# Patient Record
Sex: Male | Born: 1990 | Race: White | Hispanic: No | Marital: Single | Smoking: Current every day smoker
Health system: Southern US, Community
[De-identification: ages and names within clinical notes are randomized; demographics above are authoritative.]

## PROBLEM LIST (undated history)

## (undated) DIAGNOSIS — H9190 Unspecified hearing loss, unspecified ear: Secondary | ICD-10-CM

## (undated) HISTORY — PX: MASTOIDECTOMY: SUR855

---
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
## 2010-08-17 ENCOUNTER — Emergency Department (HOSPITAL_COMMUNITY): Payer: Self-pay

## 2010-08-17 ENCOUNTER — Emergency Department (HOSPITAL_COMMUNITY)
Admission: EM | Admit: 2010-08-17 | Discharge: 2010-08-18 | Disposition: A | Discharge status: Home / Self Care | Payer: Self-pay | Attending: Emergency Medicine | Admitting: Emergency Medicine

## 2010-08-17 DIAGNOSIS — M25476 Effusion, unspecified foot: Secondary | ICD-10-CM | POA: Insufficient documentation

## 2010-08-17 DIAGNOSIS — M25473 Effusion, unspecified ankle: Secondary | ICD-10-CM | POA: Insufficient documentation

## 2010-08-17 DIAGNOSIS — M79609 Pain in unspecified limb: Secondary | ICD-10-CM | POA: Insufficient documentation

## 2010-08-17 DIAGNOSIS — W108XXA Fall (on) (from) other stairs and steps, initial encounter: Secondary | ICD-10-CM | POA: Insufficient documentation

## 2010-08-17 DIAGNOSIS — S93409A Sprain of unspecified ligament of unspecified ankle, initial encounter: Secondary | ICD-10-CM | POA: Insufficient documentation

## 2010-08-17 DIAGNOSIS — M25579 Pain in unspecified ankle and joints of unspecified foot: Secondary | ICD-10-CM | POA: Insufficient documentation

## 2011-03-22 ENCOUNTER — Emergency Department (HOSPITAL_COMMUNITY)
Admission: EM | Admit: 2011-03-22 | Discharge: 2011-03-22 | Disposition: A | Discharge status: Home / Self Care | Payer: No Typology Code available for payment source | Attending: Emergency Medicine | Admitting: Emergency Medicine

## 2011-03-22 ENCOUNTER — Encounter: Payer: Self-pay | Admitting: Nurse Practitioner

## 2011-03-22 ENCOUNTER — Emergency Department (HOSPITAL_COMMUNITY): Payer: Self-pay

## 2011-03-22 DIAGNOSIS — M25579 Pain in unspecified ankle and joints of unspecified foot: Secondary | ICD-10-CM | POA: Insufficient documentation

## 2011-03-22 DIAGNOSIS — R51 Headache: Secondary | ICD-10-CM | POA: Insufficient documentation

## 2011-03-22 DIAGNOSIS — M25559 Pain in unspecified hip: Secondary | ICD-10-CM | POA: Insufficient documentation

## 2011-03-22 DIAGNOSIS — M25572 Pain in left ankle and joints of left foot: Secondary | ICD-10-CM

## 2011-03-22 DIAGNOSIS — S161XXA Strain of muscle, fascia and tendon at neck level, initial encounter: Secondary | ICD-10-CM

## 2011-03-22 DIAGNOSIS — S139XXA Sprain of joints and ligaments of unspecified parts of neck, initial encounter: Secondary | ICD-10-CM | POA: Insufficient documentation

## 2011-03-22 DIAGNOSIS — M25569 Pain in unspecified knee: Secondary | ICD-10-CM | POA: Insufficient documentation

## 2011-03-22 DIAGNOSIS — IMO0002 Reserved for concepts with insufficient information to code with codable children: Secondary | ICD-10-CM | POA: Insufficient documentation

## 2011-03-22 DIAGNOSIS — R079 Chest pain, unspecified: Secondary | ICD-10-CM | POA: Insufficient documentation

## 2011-03-22 MED ORDER — DIAZEPAM 5 MG PO TABS: 5.0000 mg | ORAL_TABLET | Freq: Three times a day (TID) | ORAL | Status: AC | PRN

## 2011-03-22 MED ORDER — IBUPROFEN 800 MG PO TABS: 800.0000 mg | ORAL_TABLET | Freq: Three times a day (TID) | ORAL | Status: AC
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
## 2011-03-23 NOTE — ED Provider Notes (Signed)
Medical screening examination/treatment/procedure(s) were performed by non-physician practitioner and as supervising physician I was immediately available for consultation/collaboration.   Gerhard Munch, MD 03/23/11 [phone]

## 2012-02-01 ENCOUNTER — Encounter (HOSPITAL_COMMUNITY): Payer: Self-pay | Admitting: Emergency Medicine

## 2012-02-01 ENCOUNTER — Emergency Department (HOSPITAL_COMMUNITY)
Admission: EM | Admit: 2012-02-01 | Discharge: 2012-02-01 | Disposition: A | Discharge status: Home / Self Care | Payer: Self-pay | Attending: Emergency Medicine | Admitting: Emergency Medicine

## 2012-02-01 DIAGNOSIS — F1010 Alcohol abuse, uncomplicated: Secondary | ICD-10-CM | POA: Insufficient documentation

## 2012-02-01 DIAGNOSIS — F10929 Alcohol use, unspecified with intoxication, unspecified: Secondary | ICD-10-CM

## 2012-02-01 DIAGNOSIS — R404 Transient alteration of awareness: Secondary | ICD-10-CM | POA: Insufficient documentation

## 2012-02-01 DIAGNOSIS — F17200 Nicotine dependence, unspecified, uncomplicated: Secondary | ICD-10-CM | POA: Insufficient documentation

## 2012-02-01 DIAGNOSIS — F1210 Cannabis abuse, uncomplicated: Secondary | ICD-10-CM | POA: Insufficient documentation

## 2012-02-01 DIAGNOSIS — Z8669 Personal history of other diseases of the nervous system and sense organs: Secondary | ICD-10-CM | POA: Insufficient documentation

## 2012-02-01 HISTORY — DX: Unspecified hearing loss, unspecified ear: H91.90

## 2012-02-01 LAB — CBC
HCT: 43.4 % (ref 39.0–52.0)
Hemoglobin: 15.1 g/dL (ref 13.0–17.0)
MCHC: 34.8 g/dL (ref 30.0–36.0)
RBC: 5.05 MIL/uL (ref 4.22–5.81)
WBC: 9.7 10*3/uL (ref 4.0–10.5)

## 2012-02-01 LAB — POCT I-STAT, CHEM 8
Calcium, Ion: 1.12 mmol/L (ref 1.12–1.23)
Creatinine, Ser: 1.3 mg/dL (ref 0.50–1.35)
Glucose, Bld: 108 mg/dL — ABNORMAL HIGH (ref 70–99)
HCT: 48 % (ref 39.0–52.0)
Hemoglobin: 16.3 g/dL (ref 13.0–17.0)
Potassium: 3.4 mEq/L — ABNORMAL LOW (ref 3.5–5.1)
TCO2: 22 mmol/L (ref 0–100)

## 2012-02-01 LAB — RAPID URINE DRUG SCREEN, HOSP PERFORMED
Amphetamines: NOT DETECTED
Benzodiazepines: NOT DETECTED
Cocaine: NOT DETECTED
Opiates: NOT DETECTED
Tetrahydrocannabinol: NOT DETECTED

## 2012-02-01 LAB — ETHANOL: Alcohol, Ethyl (B): 290 mg/dL — ABNORMAL HIGH (ref 0–11)

## 2012-02-01 MED ORDER — POTASSIUM CHLORIDE 10 MEQ/100ML IV SOLN
10.0000 meq | Freq: Once | INTRAVENOUS | Status: AC
  Administered 2012-02-01: 10 meq via INTRAVENOUS
  Filled 2012-02-01: qty 100

## 2012-02-01 MED ORDER — POTASSIUM CHLORIDE CRYS ER 20 MEQ PO TBCR: 20.0000 meq | EXTENDED_RELEASE_TABLET | Freq: Once | ORAL | Status: DC

## 2012-02-01 MED ORDER — ONDANSETRON HCL 4 MG/2ML IJ SOLN
4.0000 mg | Freq: Once | INTRAMUSCULAR | Status: AC
  Administered 2012-02-01: 4 mg via INTRAVENOUS
  Filled 2012-02-01: qty 2

## 2012-02-01 MED ORDER — ONDANSETRON HCL 4 MG PO TABS: 4.0000 mg | ORAL_TABLET | Freq: Four times a day (QID) | ORAL | Status: DC

## 2012-02-01 MED ORDER — SODIUM CHLORIDE 0.9 % IV SOLN
INTRAVENOUS | Status: DC
  Administered 2012-02-01: 02:00:00 via INTRAVENOUS

## 2012-02-01 NOTE — ED Notes (Signed)
Bed:RESA<BR> Expected date:<BR> Expected time:<BR> Means of arrival:<BR> Comments:<BR> EMS
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
## 2012-02-01 NOTE — ED Notes (Signed)
Pt attempting to urinate in urinal.

## 2012-02-01 NOTE — ED Provider Notes (Signed)
History     CSN: [id number]  Arrival date & time 02/01/12  0026   First MD Initiated Contact with Patient 02/01/12 0105      Chief Complaint  Patient presents with  . Alcohol Intoxication    (Consider location/radiation/quality/duration/timing/severity/associated sxs/prior treatment) HPI BIB EMS< here with his mother who provides history, drinking ETOH tonight, was intoxicated when he came over to his mothers house, had more alcohol and then laid down on the ground and started vomiting, he was not responding to his mother so she called EMS. She does not suspect any drugs but believes that he does smoke marijuana on occasion. PT unable to provide any reliable history. Level 5 caveat applies. No fall or known trauma. Mod in severity Past Medical History  Diagnosis Date  . Hearing problem     Past Surgical History  Procedure Date  . Mastoidectomy     History reviewed. No pertinent family history.  History  Substance Use Topics  . Smoking status: Current Every Day Smoker -- 1.0 packs/day for 5 years    Types: Cigarettes  . Smokeless tobacco: Not on file  . Alcohol Use: Yes     Mother states he "doesn't drink often"      Review of Systems  Unable to perform ROS level 5 caveat as above  Allergies  Amoxicillin  Home Medications  No current outpatient prescriptions on file.  BP 117/79  Pulse 79  Temp 97.1 F (36.2 C) (Oral)  Resp 13  SpO2 98%  Physical Exam  Constitutional: He appears well-developed and well-nourished.  HENT:  Head: Normocephalic and atraumatic.  Mouth/Throat: Oropharynx is clear and moist.  Eyes: EOM are normal. Pupils are equal, round, and reactive to light. No scleral icterus.  Neck: Neck supple. No tracheal deviation present.  Cardiovascular: Regular rhythm and intact distal pulses.   Pulmonary/Chest: Effort normal. No respiratory distress.  Abdominal: Soft. Bowel sounds are normal. He exhibits no distension. There is no tenderness.  There is no rebound and no guarding.  Musculoskeletal: Normal range of motion. He exhibits no edema.  Neurological:       Awake, alert, responds to painful stimuli   Skin: Skin is warm and dry.    ED Course  Procedures (including critical care time)  Results for orders placed during the hospital encounter of 02/01/12  CBC      Component Value Range   WBC 9.7  4.0 - 10.5 K/uL   RBC 5.05  4.22 - 5.81 MIL/uL   Hemoglobin 15.1  13.0 - 17.0 g/dL   HCT 40.9  81.1 - 91.4 %   MCV 85.9  78.0 - 100.0 fL   MCH 29.9  26.0 - 34.0 pg   MCHC 34.8  30.0 - 36.0 g/dL   RDW 78.2  95.6 - 21.3 %   Platelets 277  150 - 400 K/uL  ETHANOL      Component Value Range   Alcohol, Ethyl (B) 290 (*) 0 - 11 mg/dL  POCT I-STAT, CHEM 8      Component Value Range   Sodium 144  135 - 145 mEq/L   Potassium 3.4 (*) 3.5 - 5.1 mEq/L   Chloride 107  96 - 112 mEq/L   BUN 14  6 - 23 mg/dL   Creatinine, Ser 0.86  0.50 - 1.35 mg/dL   Glucose, Bld 578 (*) 70 - 99 mg/dL   Calcium, Ion 4.69  6.29 - 1.23 mmol/L   TCO2 22  0 - 100 mmol/L  Hemoglobin 16.3  13.0 - 17.0 g/dL   HCT 33.2  95.1 - 88.4 %    Date: 02/01/2012  Rate: 87  Rhythm: normal sinus rhythm  QRS Axis: normal  Intervals: normal  ST/T Wave abnormalities: nonspecific ST changes  Conduction Disutrbances:none  Narrative Interpretation:   Old EKG Reviewed: none available   IVFs. IV zofran. Cardiac monitoring and serial evaluations - maintaining airway.   4:00 AM PT awake and A/O x 3, no further emesis. He feels comfortbale to go home, able to ambulate on his own. His mother bedside feels comfortbale to take him home. RX zofran provided. ETOH precaution verbalized as understood.   MDM   ETOH intoxication. IVFs and observation and imrpoving/ sobering condition.  No indication for imaging or further ED work up at this time.         Sunnie Nielsen, MD 02/01/12 [phone]

## 2012-02-06 ENCOUNTER — Ambulatory Visit: Payer: Self-pay | Admitting: Family Medicine

## 2012-06-26 ENCOUNTER — Emergency Department (HOSPITAL_COMMUNITY)
Admission: EM | Admit: 2012-06-26 | Discharge: 2012-06-27 | Disposition: A | Discharge status: Home / Self Care | Payer: Self-pay | Attending: Emergency Medicine | Admitting: Emergency Medicine

## 2012-06-26 ENCOUNTER — Encounter (HOSPITAL_COMMUNITY): Payer: Self-pay | Admitting: *Deleted

## 2012-06-26 DIAGNOSIS — IMO0002 Reserved for concepts with insufficient information to code with codable children: Secondary | ICD-10-CM | POA: Insufficient documentation

## 2012-06-26 DIAGNOSIS — S62609A Fracture of unspecified phalanx of unspecified finger, initial encounter for closed fracture: Secondary | ICD-10-CM

## 2012-06-26 DIAGNOSIS — R209 Unspecified disturbances of skin sensation: Secondary | ICD-10-CM | POA: Insufficient documentation

## 2012-06-26 DIAGNOSIS — W19XXXA Unspecified fall, initial encounter: Secondary | ICD-10-CM

## 2012-06-26 DIAGNOSIS — H9190 Unspecified hearing loss, unspecified ear: Secondary | ICD-10-CM | POA: Insufficient documentation

## 2012-06-26 DIAGNOSIS — S6000XA Contusion of unspecified finger without damage to nail, initial encounter: Secondary | ICD-10-CM | POA: Insufficient documentation

## 2012-06-26 DIAGNOSIS — S8992XA Unspecified injury of left lower leg, initial encounter: Secondary | ICD-10-CM

## 2012-06-26 DIAGNOSIS — W108XXA Fall (on) (from) other stairs and steps, initial encounter: Secondary | ICD-10-CM | POA: Insufficient documentation

## 2012-06-26 DIAGNOSIS — S62639A Displaced fracture of distal phalanx of unspecified finger, initial encounter for closed fracture: Secondary | ICD-10-CM | POA: Insufficient documentation

## 2012-06-26 DIAGNOSIS — F17200 Nicotine dependence, unspecified, uncomplicated: Secondary | ICD-10-CM | POA: Insufficient documentation

## 2012-06-26 DIAGNOSIS — Y9301 Activity, walking, marching and hiking: Secondary | ICD-10-CM | POA: Insufficient documentation

## 2012-06-26 DIAGNOSIS — Y9229 Other specified public building as the place of occurrence of the external cause: Secondary | ICD-10-CM | POA: Insufficient documentation

## 2012-06-26 NOTE — ED Notes (Signed)
Pt fell down motel steps onto concrete,  Left knee pain,  Severe left pinky pain

## 2012-06-27 ENCOUNTER — Emergency Department (HOSPITAL_COMMUNITY): Payer: Self-pay

## 2012-06-27 ENCOUNTER — Telehealth (HOSPITAL_COMMUNITY): Payer: Self-pay | Admitting: Emergency Medicine

## 2012-06-27 MED ORDER — HYDROCODONE-ACETAMINOPHEN 5-325 MG PO TABS: 2.0000 | ORAL_TABLET | ORAL | Status: DC | PRN

## 2012-06-27 MED ORDER — HYDROCODONE-ACETAMINOPHEN 5-325 MG PO TABS
1.0000 | ORAL_TABLET | Freq: Once | ORAL | Status: AC
  Administered 2012-06-27: 1 via ORAL
  Filled 2012-06-27: qty 1

## 2012-06-27 NOTE — ED Provider Notes (Signed)
History     CSN: [id number]  Arrival date & time 06/26/12  2313   First MD Initiated Contact with Patient 06/27/12 0107      Chief Complaint  Patient presents with  . Fall  . Finger Injury  . Knee Injury    (Consider location/radiation/quality/duration/timing/severity/associated sxs/prior treatment) HPI Comments: Patient is a 22 y/o M presenting to the ED after falling down stairs. Patient reported that he was walking down the stairs and lost his footing, patient reported landing on concrete. Patient reported pain predominantly to the left fifth digit and left knee. Patient described pain to left fifth digit as a constant throbbing sensation with intermittent sharp pain that is worse with motion with radiation to the left thenar region. Associated symptoms are tingling and numbness to the left fifth digit. Patient reported that left knee pain is mild, described as intermittent throbbing pain without radiation. Denied LOC, chest pain, shortness of breathe, dizziness, blurred vision, amnesia.   Patient denied alcohol use or illicit drug use at the time.  Patient is a 22 y.o. male presenting with fall. The history is provided by the patient. No language interpreter was used.  Fall The accident occurred yesterday. The fall occurred while walking. He landed on concrete. There was no blood loss. The pain is present in the left knee (left fifth digit). The pain is at a severity of 8/10. The pain is severe (mainly to left fifth digit). There was no drug use involved in the accident. There was no alcohol use involved in the accident. Associated symptoms include numbness and tingling. Pertinent negatives include no visual change, no fever, no abdominal pain, no headaches, no hearing loss and no loss of consciousness. The symptoms are aggravated by activity. He has tried nothing for the symptoms.    Past Medical History  Diagnosis Date  . Hearing problem     Past Surgical History  Procedure  Laterality Date  . Mastoidectomy      History reviewed. No pertinent family history.  History  Substance Use Topics  . Smoking status: Current Every Day Smoker -- 1.00 packs/day for 5 years    Types: Cigarettes  . Smokeless tobacco: Not on file  . Alcohol Use: Yes     Comment: Mother states he "doesn't drink often"      Review of Systems  Constitutional: Negative for fever.  HENT: Negative for neck pain.   Eyes: Negative for visual disturbance.  Cardiovascular: Negative for chest pain.  Gastrointestinal: Negative for abdominal pain.  Musculoskeletal: Positive for arthralgias.       Left knee pain  Neurological: Positive for tingling and numbness. Negative for dizziness, loss of consciousness and headaches.  10 Systems reviewed and are negative for acute change except as noted in the HPI.   Allergies  Amoxicillin  Home Medications   Current Outpatient Rx  Name  Route  Sig  Dispense  Refill  . HYDROcodone-acetaminophen (NORCO/VICODIN) 5-325 MG per tablet   Oral   Take 2 tablets by mouth every 4 (four) hours as needed for pain.   10 tablet   0     BP 120/81  Pulse 74  Temp(Src) 98.1 F (36.7 C) (Oral)  Resp 18  Wt 135 lb (61.236 kg)  SpO2 97%  Physical Exam  Nursing note and vitals reviewed. Constitutional: He appears well-developed and well-nourished.  HENT:  Head: Normocephalic and atraumatic.  Eyes: Conjunctivae and EOM are normal. Pupils are equal, round, and reactive to light. Right eye exhibits  no discharge. Left eye exhibits no discharge.  Neck: Normal range of motion. Neck supple.  Cardiovascular: Normal rate, regular rhythm, normal heart sounds and intact distal pulses.  Exam reveals no friction rub.   No murmur heard. Peripheral pulses palpable.  Pulmonary/Chest: Effort normal and breath sounds normal. He has no wheezes. He has no rales.  Musculoskeletal: He exhibits tenderness.  Tenderness upon palpation to the left fifth digit. Mild bruising to  left fifth digit. Decreased ROM to the left hand - patient unable to form first to left hand due to pain in the fifth digit.   Mild pain upon palpation to proximal tibia and patella of left leg. No effusion, swelling, inflammation, erythema noted. Full ROM without pain.  Intact sensation to upper and lower extremity with sharp and soft touch. Peripheral pulses palpable.    Neurological: He is alert.  Skin: Skin is warm and dry. No rash noted. He is not diaphoretic. No erythema.  Scratch to left mid-forearm Scrape to left fifth digit     ED Course  Procedures (including critical care time)  Labs Reviewed - No data to display Dg Knee 2 Views Left  06/27/2012  *RADIOLOGY REPORT*  Clinical Data: Fall, anterior left knee pain  LEFT KNEE - 1-2 VIEW  Comparison: None.  Findings: Small calcific density anterior to the proximal tibia. No donor site visualized.  Otherwise, no acute fracture or dislocation.  No definite joint effusion.  IMPRESSION: Tiny calcific density anterior to the proximal tibia, may reflect an avulsed fragment.  Correlate for point tenderness.   Original Report Authenticated By: Jearld Lesch, M.D.    Dg Hand Complete Left  06/27/2012  *RADIOLOGY REPORT*  Clinical Data: Fall, left hand pain  LEFT HAND - COMPLETE 3+ VIEW  Comparison: None.  Findings: Fracture at the base of the distal phalanx fifth digit with volar displacement of the distal component.  The DIP joint is in a mild forced flexed position.  No dislocation.  No additional fracture identified.  IMPRESSION: Fracture base of the distal phalanx fifth digit as above.   Original Report Authenticated By: Jearld Lesch, M.D.     Filed Vitals:   06/27/12 0359  BP: 120/81  Pulse: 74  Temp:   Resp: 18    1. Finger fracture, left, closed, initial encounter   2. Knee injury, left, initial encounter   3. Fall, initial encounter       MDM  Patient is a 22 y/o M presenting to ED after falling down stairs and  landing on concrete.   I personally evaluated and examined the patient.  Alert, conscious. Extreme pain upon soft touch to left fifth digit. Full ROM to left knee, mild pain to the proximal tibia. No erythema, effusion, swelling, inflammation to the left knee or left wrist. Full ROM to left wrist. No neurovascular damage.  DG Knee 2 View: sprain DG Hand left: fracture base of distal phalanx fifth digit  Gave patient one dose of pain medications - patient arranged a ride home  Discharge patient. Fracture to distal left fifth digit, sprain to left knee. Splint set for left fifth digit, discussed with patient to keep splint on at all times. Discussed with patient to elevate left knee and apply ice for relief. Discussed with patient to follow-up with orthopedic to discuss condition. Discharged patient with pain medications - discussed with patient that he cannot drive or operate heavy machinery while on medications. Discussed with patient that if symptoms are to worsen  to please report back to the ED. Patient agreed to plan of care, understood, all questions answered.    Raymon Mutton, PA-C 06/27/12 [phone]

## 2012-06-27 NOTE — ED Notes (Signed)
PA at bedside speaking to pt.

## 2012-06-27 NOTE — ED Notes (Signed)
Pt back from radiology at this time.

## 2012-06-28 NOTE — ED Provider Notes (Signed)
Medical screening examination/treatment/procedure(s) were performed by non-physician practitioner and as supervising physician I was immediately available for consultation/collaboration.  Jones Skene, M.D.      Jones Skene, MD 06/28/12 [phone]

## 2012-06-29 ENCOUNTER — Encounter (HOSPITAL_COMMUNITY): Payer: Self-pay | Admitting: *Deleted

## 2012-06-29 ENCOUNTER — Emergency Department (HOSPITAL_COMMUNITY)
Admission: EM | Admit: 2012-06-29 | Discharge: 2012-06-30 | Disposition: A | Discharge status: Home / Self Care | Payer: Self-pay | Attending: Emergency Medicine | Admitting: Emergency Medicine

## 2012-06-29 DIAGNOSIS — Z8669 Personal history of other diseases of the nervous system and sense organs: Secondary | ICD-10-CM | POA: Insufficient documentation

## 2012-06-29 DIAGNOSIS — G8911 Acute pain due to trauma: Secondary | ICD-10-CM | POA: Insufficient documentation

## 2012-06-29 DIAGNOSIS — F17200 Nicotine dependence, unspecified, uncomplicated: Secondary | ICD-10-CM | POA: Insufficient documentation

## 2012-06-29 DIAGNOSIS — Z87828 Personal history of other (healed) physical injury and trauma: Secondary | ICD-10-CM | POA: Insufficient documentation

## 2012-06-29 DIAGNOSIS — S62637D Displaced fracture of distal phalanx of left little finger, subsequent encounter for fracture with routine healing: Secondary | ICD-10-CM

## 2012-06-29 NOTE — ED Provider Notes (Signed)
History    This chart was scribed for non-physician practitioner working with Glynn Octave, MD by Leone Payor, ED Scribe. This patient was seen in room [facility] and the patient's care was started at 2306.   CSN: [id number]  Arrival date & time 06/29/12  2306   First MD Initiated Contact with Patient 06/29/12 2345      Chief Complaint  Patient presents with  . Finger Injury     The history is provided by the patient. No language interpreter was used.    Kristopher Robinson is a 22 y.o. male who presents to the Emergency Department complaining of left hand pain and swelling starting 30 minutes PTA. Pt fell down some concrete steps about 3 days ago after which he was seen in the ED and was told he fractured his left hand. His L finger pinky was splinted before discharge but pt states that the swelling became so bad that he had to remove the splint and his pain returned.  Pain is rated at a 7/10, located in the L little finger and non radiating.  Pt has not tried ice or elevation. He states he is out of his vicodin, but that did relieve the pain.    Pt is a current everyday smoker and occasional alcohol user.  Past Medical History  Diagnosis Date  . Hearing problem     Past Surgical History  Procedure Laterality Date  . Mastoidectomy      History reviewed. No pertinent family history.  History  Substance Use Topics  . Smoking status: Current Every Day Smoker -- 1.00 packs/day for 5 years    Types: Cigarettes  . Smokeless tobacco: Not on file  . Alcohol Use: Yes     Comment: Mother states he "doesn't drink often"      Review of Systems  Constitutional: Negative for fever, diaphoresis, appetite change, fatigue and unexpected weight change.  HENT: Negative for mouth sores and neck stiffness.   Eyes: Negative for visual disturbance.  Respiratory: Negative for cough, chest tightness, shortness of breath and wheezing.   Cardiovascular: Negative for chest pain.   Gastrointestinal: Negative for nausea, vomiting, abdominal pain, diarrhea and constipation.  Endocrine: Negative for polydipsia, polyphagia and polyuria.  Genitourinary: Negative for dysuria, urgency, frequency and hematuria.  Musculoskeletal: Positive for joint swelling and arthralgias. Negative for back pain.  Skin: Negative for rash.  Allergic/Immunologic: Negative for immunocompromised state.  Neurological: Negative for syncope, light-headedness and headaches.  Hematological: Does not bruise/bleed easily.  Psychiatric/Behavioral: Negative for sleep disturbance. The patient is not nervous/anxious.     Allergies  Amoxicillin  Home Medications   Current Outpatient Rx  Name  Route  Sig  Dispense  Refill  . ibuprofen (ADVIL,MOTRIN) 200 MG tablet   Oral   Take 600 mg by mouth every 6 (six) hours as needed for pain. Swelling in my finger         . HYDROcodone-acetaminophen (NORCO/VICODIN) 5-325 MG per tablet   Oral   Take 1 tablet by mouth every 4 (four) hours as needed for pain.   10 tablet   0     BP 119/73  Pulse 94  Temp(Src) 98.1 F (36.7 C) (Oral)  Resp 18  SpO2 100%  Physical Exam  Nursing note and vitals reviewed. Constitutional: He appears well-developed and well-nourished. No distress.  HENT:  Head: Normocephalic and atraumatic.  Eyes: Conjunctivae are normal.  Neck: Normal range of motion.  Cardiovascular: Normal rate, regular rhythm, normal heart sounds and  intact distal pulses.  Exam reveals no gallop and no friction rub.   No murmur heard. Good cap refill, < 3 sec   Pulmonary/Chest: Effort normal and breath sounds normal.  Musculoskeletal: He exhibits edema and tenderness.  ROM: Limited ROM of left pinky.   Mild deformity to left pinky. Bruising and swelling. Healing abrasions to the left hand.   Neurological: He is alert. Coordination normal.  Sensation intact Strength decreased 2/2 pain  Skin: Skin is warm and dry. No rash noted. He is not  diaphoretic. No erythema.  No tenting of the skin Ecchymosis of the little finger persists  Psychiatric: He has a normal mood and affect.    ED Course  Procedures (including critical care time)  DIAGNOSTIC STUDIES: Oxygen Saturation is 100% on room air, normal by my interpretation.    COORDINATION OF CARE: 12:02 AM Discussed treatment plan with pt at bedside and pt agreed to plan.    Labs Reviewed - No data to display No results found.   1. Closed displaced fracture of distal phalanx of left little finger, with routine healing, subsequent encounter       MDM  Kristopher Robinson presents for re-evaluation of finger after initial fall and fracture on 06/27/12.  Pt x-ray reviewed with Fracture base of the distal phalanx fifth digit.  I personally reviewed the imaging tests through PACS system.  I reviewed available ER/hospitalization records through the EMR.  Pt denies trauma or other further injury to the finger and I do not believe further imaging is warranted.  Pt resplinted and pain controlled in the ER.  Again, recommended Hand follow-up for further evaluation.  Also recommended conservative treatment and not removing the splint until evaluated by Hand surgery.     I personally performed the services described in this documentation, which was scribed in my presence. The recorded information has been reviewed and is accurate.   Dahlia Client Kiylee Thoreson, PA-C 06/30/12 [phone]

## 2012-06-30 MED ORDER — HYDROCODONE-ACETAMINOPHEN 5-325 MG PO TABS: 1.0000 | ORAL_TABLET | ORAL | Status: DC | PRN

## 2012-06-30 MED ORDER — HYDROCODONE-ACETAMINOPHEN 5-325 MG PO TABS
2.0000 | ORAL_TABLET | Freq: Once | ORAL | Status: AC
  Administered 2012-06-30: 2 via ORAL
  Filled 2012-06-30: qty 2

## 2012-06-30 NOTE — ED Provider Notes (Signed)
Medical screening examination/treatment/procedure(s) were performed by non-physician practitioner and as supervising physician I was immediately available for consultation/collaboration.  Saron Vanorman, MD 06/30/12 0313 

## 2012-06-30 NOTE — ED Notes (Signed)
Per pt, pt was here last Saturday because he injured his left 5th finger; states that he has "fracture" on the affected finger, a splint to left 5th finger was placed. Pt states that he was told to come back to ED for s/s increased pain and swelling--- pt states he observed increased pain and swelling to left 5th finger. Pt further reported that he tried to arrange appointment with orthopedic doctor "but money is tight right now".

## 2012-10-27 ENCOUNTER — Emergency Department (HOSPITAL_COMMUNITY)
Admission: EM | Admit: 2012-10-27 | Discharge: 2012-10-27 | Disposition: A | Discharge status: Home / Self Care | Payer: Self-pay | Attending: Emergency Medicine | Admitting: Emergency Medicine

## 2012-10-27 DIAGNOSIS — F17200 Nicotine dependence, unspecified, uncomplicated: Secondary | ICD-10-CM | POA: Insufficient documentation

## 2012-10-27 DIAGNOSIS — Z113 Encounter for screening for infections with a predominantly sexual mode of transmission: Secondary | ICD-10-CM | POA: Insufficient documentation

## 2012-10-27 DIAGNOSIS — R300 Dysuria: Secondary | ICD-10-CM | POA: Insufficient documentation

## 2012-10-27 DIAGNOSIS — Z202 Contact with and (suspected) exposure to infections with a predominantly sexual mode of transmission: Secondary | ICD-10-CM

## 2012-10-27 MED ORDER — AZITHROMYCIN 250 MG PO TABS
1000.0000 mg | ORAL_TABLET | Freq: Once | ORAL | Status: AC
  Administered 2012-10-27: 1000 mg via ORAL
  Filled 2012-10-27: qty 4

## 2012-10-27 NOTE — ED Provider Notes (Signed)
History    This chart was scribed for non-physician practitioner Magnus Sinning, PA-C, working with Gilda Crease, * by Donne Anon, ED Scribe. This patient was seen in room WTR8/WTR8 and the patient's care was started at 1936.  CSN: [id number] Arrival date & time 10/27/12  1925  First MD Initiated Contact with Patient 10/27/12 1936     Chief Complaint  Patient presents with  . SEXUALLY TRANSMITTED DISEASE    The history is provided by the patient. No language interpreter was used.   HPI Comments: Kristopher Robinson is a 22 y.o. male who presents to the Emergency Department complaining of 2-3 weeks of gradual onset, gradually worsening dysuria. He states his partner was diagnosed with chlamydia recently and she has been treated. He reports assocaited clear penile discharge. He denies fever, chills, nausea, vomiting, lesions on his penis or scrotum, scrotum pain or any other pain. He denies a hx of sexually transmitted infection or urinary tract infection.   Past Medical History  Diagnosis Date  . Hearing problem    Past Surgical History  Procedure Laterality Date  . Mastoidectomy     No family history on file. History  Substance Use Topics  . Smoking status: Current Every Day Smoker -- 1.00 packs/day for 5 years    Types: Cigarettes  . Smokeless tobacco: Not on file  . Alcohol Use: Yes     Comment: Mother states he "doesn't drink often"    Review of Systems  Constitutional: Negative for fever and chills.  Gastrointestinal: Negative for nausea and vomiting.  Genitourinary: Positive for dysuria and discharge. Negative for genital sores, penile pain and testicular pain.  All other systems reviewed and are negative.    Allergies  Amoxicillin  Home Medications   Current Outpatient Rx  Name  Route  Sig  Dispense  Refill  . HYDROcodone-acetaminophen (NORCO/VICODIN) 5-325 MG per tablet   Oral   Take 1 tablet by mouth every 4 (four) hours as needed for  pain.   10 tablet   0   . ibuprofen (ADVIL,MOTRIN) 200 MG tablet   Oral   Take 600 mg by mouth every 6 (six) hours as needed for pain. Swelling in my finger          BP 116/71  Pulse 72  Temp(Src) 98.5 F (36.9 C) (Oral)  Resp 16  SpO2 100%  Physical Exam  Nursing note and vitals reviewed. Constitutional: He appears well-developed and well-nourished. No distress.  HENT:  Head: Normocephalic and atraumatic.  Eyes: Conjunctivae are normal.  Neck: Neck supple. No tracheal deviation present.  Cardiovascular: Normal rate, regular rhythm and normal heart sounds.  Exam reveals no gallop and no friction rub.   No murmur heard. Pulmonary/Chest: Effort normal and breath sounds normal. No respiratory distress. He has no wheezes. He has no rales.  Genitourinary: Testes normal and penis normal. Right testis shows no mass, no swelling and no tenderness. Left testis shows no mass, no swelling and no tenderness. Circumcised. No penile erythema.  Chaperone present.   Musculoskeletal: Normal range of motion.  Neurological: He is alert.  Skin: Skin is warm and dry.  Psychiatric: He has a normal mood and affect. His behavior is normal.    ED Course  Procedures (including critical care time) DIAGNOSTIC STUDIES: Oxygen Saturation is 10% on RA, normal by my interpretation.    COORDINATION OF CARE: 7:57 PM Discussed treatment plan which includes chlamydia culture and an antibiotic with pt at bedside and  pt agreed to plan.     Labs Reviewed - No data to display No results found. No diagnosis found.  MDM  Patient reports recent exposure to Chlamydia.  He reports dysuria and a small amount of penile discharge.  On exam, no lesions and no scrotal pain.  Patient treated for Chlamydia with Azithromycin.  GC/Chlamydia results pending.  I personally performed the services described in this documentation, which was scribed in my presence. The recorded information has been reviewed and is  accurate.    Pascal [place], PA-C 10/27/12 2050

## 2012-10-27 NOTE — ED Notes (Signed)
Pt c/o burning to penis with urination. Pt states he has some drainage from penis. Pt states his "baby's mama" was diagnosed with chlamydia and pt thinks that this is what has. Pt c/o symptoms x one month. Pt ambulatory to exam room with steady gait.

## 2012-10-27 NOTE — ED Provider Notes (Signed)
Medical screening examination/treatment/procedure(s) were performed by non-physician practitioner and as supervising physician I was immediately available for consultation/collaboration.    Christopher J. Pollina, MD 10/27/12 2329 

## 2012-10-29 ENCOUNTER — Telehealth (HOSPITAL_COMMUNITY): Payer: Self-pay | Admitting: *Deleted

## 2012-10-29 NOTE — ED Notes (Signed)
+   Chlamydia Patient treated with [facility] letter faxed.

## 2012-10-30 ENCOUNTER — Telehealth (HOSPITAL_COMMUNITY): Payer: Self-pay | Admitting: Emergency Medicine

## 2012-10-31 ENCOUNTER — Telehealth (HOSPITAL_COMMUNITY): Payer: Self-pay | Admitting: Emergency Medicine

## 2012-11-08 NOTE — ED Notes (Signed)
No response from letter after 30 days.Chart closed out and sent to medical records 

## 2014-11-15 ENCOUNTER — Emergency Department (HOSPITAL_COMMUNITY)
Admission: EM | Admit: 2014-11-15 | Discharge: 2014-11-15 | Discharge status: Against Medical Advice | Payer: Self-pay | Attending: Emergency Medicine | Admitting: Emergency Medicine

## 2014-11-15 ENCOUNTER — Encounter (HOSPITAL_COMMUNITY): Payer: Self-pay | Admitting: *Deleted

## 2014-11-15 DIAGNOSIS — Y998 Other external cause status: Secondary | ICD-10-CM | POA: Insufficient documentation

## 2014-11-15 DIAGNOSIS — S99911A Unspecified injury of right ankle, initial encounter: Secondary | ICD-10-CM | POA: Insufficient documentation

## 2014-11-15 DIAGNOSIS — Y30XXXA Falling, jumping or pushed from a high place, undetermined intent, initial encounter: Secondary | ICD-10-CM | POA: Insufficient documentation

## 2014-11-15 DIAGNOSIS — Z720 Tobacco use: Secondary | ICD-10-CM | POA: Insufficient documentation

## 2014-11-15 DIAGNOSIS — Y9389 Activity, other specified: Secondary | ICD-10-CM | POA: Insufficient documentation

## 2014-11-15 DIAGNOSIS — Y929 Unspecified place or not applicable: Secondary | ICD-10-CM | POA: Insufficient documentation

## 2014-11-15 NOTE — ED Notes (Signed)
Pt approached nurse first and stated he could not wait. Pt encouraged to stay. Pt still chose to leave.

## 2014-11-15 NOTE — ED Notes (Signed)
Pt states that he does not wish to wait. Ambulated out of the ED. No distress noted.

## 2014-11-15 NOTE — ED Notes (Signed)
Pulse present to right foot, swelling and bruising also noted.

## 2014-11-15 NOTE — ED Notes (Signed)
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 States he landed on his right side, hurting his ankle.

## 2014-11-16 ENCOUNTER — Emergency Department (HOSPITAL_COMMUNITY)
Admission: EM | Admit: 2014-11-16 | Discharge: 2014-11-16 | Disposition: A | Discharge status: Home / Self Care | Payer: Self-pay | Attending: Emergency Medicine | Admitting: Emergency Medicine

## 2014-11-16 ENCOUNTER — Emergency Department (HOSPITAL_COMMUNITY): Payer: Self-pay

## 2014-11-16 ENCOUNTER — Encounter (HOSPITAL_COMMUNITY): Payer: Self-pay | Admitting: *Deleted

## 2014-11-16 DIAGNOSIS — Y998 Other external cause status: Secondary | ICD-10-CM | POA: Insufficient documentation

## 2014-11-16 DIAGNOSIS — Y9389 Activity, other specified: Secondary | ICD-10-CM | POA: Insufficient documentation

## 2014-11-16 DIAGNOSIS — S92901A Unspecified fracture of right foot, initial encounter for closed fracture: Secondary | ICD-10-CM

## 2014-11-16 DIAGNOSIS — S92344A Nondisplaced fracture of fourth metatarsal bone, right foot, initial encounter for closed fracture: Secondary | ICD-10-CM | POA: Insufficient documentation

## 2014-11-16 DIAGNOSIS — S92334A Nondisplaced fracture of third metatarsal bone, right foot, initial encounter for closed fracture: Secondary | ICD-10-CM | POA: Insufficient documentation

## 2014-11-16 DIAGNOSIS — Z720 Tobacco use: Secondary | ICD-10-CM | POA: Insufficient documentation

## 2014-11-16 DIAGNOSIS — Z880 Allergy status to penicillin: Secondary | ICD-10-CM | POA: Insufficient documentation

## 2014-11-16 DIAGNOSIS — W132XXA Fall from, out of or through roof, initial encounter: Secondary | ICD-10-CM | POA: Insufficient documentation

## 2014-11-16 DIAGNOSIS — Y9289 Other specified places as the place of occurrence of the external cause: Secondary | ICD-10-CM | POA: Insufficient documentation

## 2014-11-16 MED ORDER — HYDROCODONE-ACETAMINOPHEN 5-325 MG PO TABS
1.0000 | ORAL_TABLET | Freq: Once | ORAL | Status: AC
  Administered 2014-11-16: 1 via ORAL
  Filled 2014-11-16: qty 1

## 2014-11-16 MED ORDER — OXYCODONE-ACETAMINOPHEN 5-325 MG PO TABS
1.0000 | ORAL_TABLET | Freq: Once | ORAL | Status: AC
  Administered 2014-11-16: 1 via ORAL
  Filled 2014-11-16: qty 1

## 2014-11-16 MED ORDER — OXYCODONE-ACETAMINOPHEN 5-325 MG PO TABS: 2.0000 | ORAL_TABLET | ORAL | Status: DC | PRN

## 2014-11-16 MED ORDER — KETOROLAC TROMETHAMINE 60 MG/2ML IM SOLN
60.0000 mg | Freq: Once | INTRAMUSCULAR | Status: DC
  Filled 2014-11-16: qty 2

## 2014-11-16 NOTE — ED Notes (Signed)
Ortho called and on their way.  Patient advised that ortho is coming to place splint and give him crutches.

## 2014-11-16 NOTE — ED Notes (Signed)
Ortho at bedside to place splint and crutches.

## 2014-11-16 NOTE — Progress Notes (Signed)
Orthopedic Tech Progress Note Patient Details:  Kristopher Robinson 07-Apr-1991 [id number]  Ortho Devices Type of Ortho Device: Crutches, Post (short leg) splint Ortho Device/Splint Location: rle Ortho Device/Splint Interventions: Application   Marrio Scribner 11/16/2014, 2:54 PM

## 2014-11-16 NOTE — ED Notes (Signed)
Pt reports falling off a roof last night, only complaint is right foot and ankle pain.

## 2014-11-16 NOTE — Discharge Instructions (Signed)
Metatarsal Fracture, Undisplaced A metatarsal fracture is a break in the bone(s) of the foot. These are the bones of the foot that connect your toes to the bones of the ankle. DIAGNOSIS  The diagnoses of these fractures are usually made with X-rays. If there are problems in the forefoot and x-rays are normal a later bone scan will usually make the diagnosis.  TREATMENT AND HOME CARE INSTRUCTIONS  Treatment may or may not include a cast or walking shoe. When casts are needed the use is usually for short periods of time so as not to slow down healing with muscle wasting (atrophy).  Activities should be stopped until further advised by your caregiver.  Wear shoes with adequate shock absorbing capabilities and stiff soles.  Alternative exercise may be undertaken while waiting for healing. These may include bicycling and swimming, or as your caregiver suggests.  It is important to keep all follow-up visits or specialty referrals. The failure to keep these appointments could result in improper bone healing and chronic pain or disability.  Warning: Do not drive a car or operate a motor vehicle until your caregiver specifically tells you it is safe to do so. IF YOU DO NOT HAVE A CAST OR SPLINT:  You may walk on your injured foot as tolerated or advised.  Do not put any weight on your injured foot for as long as directed by your caregiver. Slowly increase the amount of time you walk on the foot as the pain allows or as advised.  Use crutches until you can bear weight without pain. A gradual increase in weight bearing may help.  Apply ice to the injury for 15-20 minutes each hour while awake for the first 2 days. Put the ice in a plastic bag and place a towel between the bag of ice and your skin.  Only take over-the-counter or prescription medicines for pain, discomfort, or fever as directed by your caregiver. SEEK IMMEDIATE MEDICAL CARE IF:   Your cast gets damaged or breaks.  You have  continued severe pain or more swelling than you did before the cast was put on, or the pain is not controlled with medications.  Your skin or nails below the injury turn blue or grey, or feel cold or numb.  There is a bad smell, or new stains or pus-like (purulent) drainage coming from the cast. MAKE SURE YOU:   Understand these instructions.  Will watch your condition.  Will get help right away if you are not doing well or get worse. Document Released: 12/14/2001 Document Revised: 06/16/2011 Document Reviewed: 11/05/2007 [facility] Patient Information 2015 [place], [place]. This information is not intended to replace advice given to you by your health care provider. Make sure you discuss any questions you have with your health care provider.   No weight bearing until further recommendation from ortho. Take percocet as needed for pain.

## 2014-11-16 NOTE — ED Notes (Signed)
Pt ambulatory to discharge via crutches. Pt with verbal understanding of discharge instructions. VSS upon discharge.

## 2014-11-16 NOTE — ED Provider Notes (Signed)
CSN: [id number]     Arrival date & time 11/16/14  1217 History  This chart was scribed for non-physician practitioner Gaylyn Rong, PA-C working with Vanetta Mulders, MD by Littie Deeds, ED Scribe. This patient was seen in room TR07C/TR07C and the patient's care was started at 12:36 PM.       Chief Complaint  Patient presents with  . Fall   The history is provided by the patient. No language interpreter was used.   HPI Comments: Kristopher Robinson is a 24 y.o. male who presents to the Emergency Department complaining of a fall from a roof of a 3-story building that occurred last night. He estimates falling about 20 feet and landing on his feet. Patient reports having associated worsening right foot/ankle pain with swelling. The pain is rated 8/10 in severity. Patient denies head injury, neck pain and any other pain or injuries. He did come here last night after the fall, but did not want to stay because the wait was 4 hours. The pain has worsened since last night.  Patient works as a Designer, fashion/clothing.  Past Medical History  Diagnosis Date  . Hearing problem    Past Surgical History  Procedure Laterality Date  . Mastoidectomy     History reviewed. No pertinent family history. Social History  Substance Use Topics  . Smoking status: Current Every Day Smoker -- 1.00 packs/day for 5 years    Types: Cigarettes  . Smokeless tobacco: None  . Alcohol Use: Yes     Comment: Mother states he "doesn't drink often"    Review of Systems  Musculoskeletal: Positive for joint swelling and arthralgias. Negative for neck pain.  All other systems reviewed and are negative.     Allergies  Amoxicillin  Home Medications   Prior to Admission medications   Not on File   BP 129/72 mmHg  Pulse 79  Temp(Src) 98.2 F (36.8 C) (Oral)  Resp 18  SpO2 99% Physical Exam  Constitutional: He is oriented to person, place, and time. He appears well-developed and well-nourished. No distress.  HENT:  Head:  Normocephalic and atraumatic.  Mouth/Throat: Oropharynx is clear and moist. No oropharyngeal exudate.  Eyes: Pupils are equal, round, and reactive to light.  Neck: Neck supple.  Cardiovascular: Normal rate and intact distal pulses.   Pulmonary/Chest: Effort normal.  Musculoskeletal:  Complete ROM of the right knee. No tenderness to palpation of right knee. Limited ROM of right ankle due to pain. Swelling of dorsum of right foot. Ecchymoses on dorsal and ventral side of right foot and metatarsals.  Neurological: He is alert and oriented to person, place, and time. No cranial nerve deficit.  Skin: Skin is warm and dry. No rash noted.  Psychiatric: He has a normal mood and affect. His behavior is normal.  Nursing note and vitals reviewed.   ED Course  Procedures  Pt was seen for right foot pain after falling from a 3 story building and landing on his right foot last night.  Pt given norco Pt taken for xray of right foot, ankle, and lumbar spine Xray showed fractures in metatarsals of right foot Given percocet Posterior splint applied  Given crutches  DIAGNOSTIC STUDIES: Oxygen Saturation is 99% on room air, normal by my interpretation.    COORDINATION OF CARE: 12:42 PM-Discussed treatment plan which includes XR imaging with patient/guardian at bedside and patient/guardian agreed to plan.    Labs Review Labs Reviewed - No data to display  Imaging Review Dg Lumbar Spine  Complete  11/16/2014   CLINICAL DATA:  Larey Seat off roof, low back pain  EXAM: LUMBAR SPINE - COMPLETE 4+ VIEW  COMPARISON:  None.  FINDINGS: The lumbar vertebrae are in normal alignment. Intervertebral disc spaces appear normal. No definite pars defect is seen. The SI joints are corticated.  IMPRESSION: Normal alignment. Normal intervertebral disc spaces. No acute abnormality.   Electronically Signed   By: Dwyane Dee M.D.   On: 11/16/2014 13:55   Dg Ankle Complete Right  11/16/2014   CLINICAL DATA:  Acute right ankle  pain after falling off roof. Initial encounter.  EXAM: RIGHT ANKLE - COMPLETE 3+ VIEW  COMPARISON:  Aug 17, 2010.  FINDINGS: There is no evidence of fracture, dislocation, or joint effusion. There is no evidence of arthropathy or other focal bone abnormality. Soft tissues are unremarkable.  IMPRESSION: Normal right ankle.   Electronically Signed   By: Lupita Raider, M.D.   On: 11/16/2014 13:55   Dg Foot Complete Right  11/16/2014   CLINICAL DATA:  Larey Seat from a roof, dorsal foot pain and bruising  EXAM: RIGHT FOOT COMPLETE - 3+ VIEW  COMPARISON:  Right ankle series of today's date and of Aug 17, 2010  FINDINGS: The bones of the foot are adequately mineralized. The interphalangeal, metatarsophalangeal, and tarsometatarsal joints appear normal. There is subtle cortical irregularity of the heads of the third and fourth metatarsals. There may be minimal deformity of the second metatarsal head. The first and fifth metatarsal heads appear intact. The soft tissues are unremarkable.  IMPRESSION: Nondisplaced fractures of the third and fourth and possibly second metatarsal heads. No acute fractures are observed elsewhere within the foot.   Electronically Signed   By: David  [place] M.D.   On: 11/16/2014 13:55     EKG Interpretation None      MDM   Final diagnoses:  Fracture of foot, right, closed, initial encounter    Pt was seen for right foot pain after falling from a 3 story building and landing on right foot last night. No tenderness to palpation of knee or spine. Tib fib fracture less likely. Xrays revealed nondisplaced fractures of the third and fourth and possibly second metatarsal heads. Pt given crutches and posterior splint. Recommend immediate follow up with orthopedist. Given percocet for pain management. No weight bearing for minimum of 12 week pending recommendations from ortho.    I personally performed the services described in this documentation, which was scribed in my presence. The  recorded information has been reviewed and is accurate.    Lester Kinsman [place], PA-C 11/16/14 1518  Vanetta Mulders, MD 11/22/14 [phone]

## 2014-11-16 NOTE — ED Notes (Signed)
Patient states didn't want toradol shot.  Patient states "I just don't like shots".   PA aware and new order has been established.

## 2014-11-30 ENCOUNTER — Encounter (HOSPITAL_COMMUNITY): Payer: Self-pay | Admitting: *Deleted

## 2014-11-30 ENCOUNTER — Emergency Department (HOSPITAL_COMMUNITY)
Admission: EM | Admit: 2014-11-30 | Discharge: 2014-12-01 | Disposition: A | Discharge status: Home / Self Care | Payer: Self-pay | Attending: Emergency Medicine | Admitting: Emergency Medicine

## 2014-11-30 DIAGNOSIS — S92344D Nondisplaced fracture of fourth metatarsal bone, right foot, subsequent encounter for fracture with routine healing: Secondary | ICD-10-CM | POA: Insufficient documentation

## 2014-11-30 DIAGNOSIS — S92334D Nondisplaced fracture of third metatarsal bone, right foot, subsequent encounter for fracture with routine healing: Secondary | ICD-10-CM | POA: Insufficient documentation

## 2014-11-30 DIAGNOSIS — Z720 Tobacco use: Secondary | ICD-10-CM | POA: Insufficient documentation

## 2014-11-30 DIAGNOSIS — X58XXXD Exposure to other specified factors, subsequent encounter: Secondary | ICD-10-CM | POA: Insufficient documentation

## 2014-11-30 DIAGNOSIS — S92301D Fracture of unspecified metatarsal bone(s), right foot, subsequent encounter for fracture with routine healing: Secondary | ICD-10-CM

## 2014-11-30 NOTE — ED Notes (Signed)
Pt brought in to ER in [facility] custody; pt reports recent rt foot injury and states just had splint removed; pt was running tonight and c/o pain and injury to rt foot

## 2014-12-01 ENCOUNTER — Emergency Department (HOSPITAL_COMMUNITY): Payer: Self-pay

## 2014-12-01 ENCOUNTER — Encounter (HOSPITAL_COMMUNITY): Payer: Self-pay | Admitting: *Deleted

## 2014-12-01 MED ORDER — IBUPROFEN 600 MG PO TABS: 600.0000 mg | ORAL_TABLET | Freq: Four times a day (QID) | ORAL | Status: DC | PRN

## 2014-12-01 MED ORDER — HYDROCODONE-ACETAMINOPHEN 5-325 MG PO TABS
1.0000 | ORAL_TABLET | Freq: Once | ORAL | Status: AC
  Administered 2014-12-01: 1 via ORAL
  Filled 2014-12-01: qty 1

## 2014-12-01 NOTE — ED Notes (Signed)
Patient is alert and oriented x3.  He was given DC instructions and follow up visit instructions.  Patient gave verbal understanding.  He was DC ambulatory under his own power to jail.  V/S stable.  He was not showing any signs of distress on DC 

## 2014-12-01 NOTE — ED Provider Notes (Signed)
CSN: [id number]     Arrival date & time 11/30/14  2350 History   First MD Initiated Contact with Patient 11/30/14 2354     Chief Complaint  Patient presents with  . Foot Pain     (Consider location/radiation/quality/duration/timing/severity/associated sxs/prior Treatment) HPI Comments: Patient in [facility] custody presenting for R foot pain. Patient with history of 3rd, 4th, and possibly 2nd metatarsal head fractures from 11/16/14. Patient reports f/u with an orthopedic last week who removed his splint and placed him in a CAM walker. Patient removed this boot this evening and has been having worsening pain in his R foot after running for prolonged periods and "bumping it on things". No medications taken PTA. Patient reporting associated swelling. No inability to ambulate, numbness or weakness associated with symptoms. No c/o ankle pain.  Patient is a 24 y.o. male presenting with lower extremity pain. The history is provided by the patient. No language interpreter was used.  Foot Pain Associated symptoms include arthralgias and joint swelling. Pertinent negatives include no numbness or weakness.    History reviewed. No pertinent past medical history. History reviewed. No pertinent past surgical history. No family history on file. Social History  Substance Use Topics  . Smoking status: Current Every Day Smoker -- 0.50 packs/day    Types: Cigarettes  . Smokeless tobacco: None  . Alcohol Use: Yes     Comment: socially    Review of Systems  Musculoskeletal: Positive for joint swelling and arthralgias.  Neurological: Negative for weakness and numbness.  All other systems reviewed and are negative.   Allergies  Review of patient's allergies indicates no known allergies.  Home Medications   Prior to Admission medications   Medication Sig Start Date End Date Taking? Authorizing Provider  ibuprofen (ADVIL,MOTRIN) 600 MG tablet Take 1 tablet (600 mg total) by mouth every 6 (six) hours as  needed. 12/01/14   Antony Madura, PA-C   BP 125/88 mmHg  Pulse 78  Resp 18  SpO2 99%   Physical Exam  Constitutional: He is oriented to person, place, and time. He appears well-developed and well-nourished. No distress.  HENT:  Head: Normocephalic and atraumatic.  Eyes: Conjunctivae and EOM are normal. No scleral icterus.  Neck: Normal range of motion.  Cardiovascular: Normal rate, regular rhythm and intact distal pulses.   DP and PT pulses 2+ in the RLE  Pulmonary/Chest: Effort normal. No respiratory distress.  Musculoskeletal: Normal range of motion.       Right ankle: Normal.       Right foot: There is tenderness and bony tenderness. There is normal range of motion, no swelling, normal capillary refill, no crepitus and no deformity.       Feet:  Neurological: He is alert and oriented to person, place, and time. He exhibits normal muscle tone. Coordination normal.  Patient able to wiggle all toes.  Skin: Skin is warm and dry. No rash noted. He is not diaphoretic. No erythema. No pallor.  Psychiatric: He has a normal mood and affect. His behavior is normal.  Nursing note and vitals reviewed.   ED Course  Procedures (including critical care time) Labs Review Labs Reviewed - No data to display  Imaging Review Dg Foot Complete Right  12/01/2014   ADDENDUM REPORT: 12/01/2014 01:20  ADDENDUM: Additional comparison studies from 11/16/2014 are obtained and reviewed. Fractures of the distal right second, third, and fourth metatarsal heads are also present on the previous study suggesting that the fractures described on the current study are  old rather than acute fractures. No significant change in appearance. No new acute fractures demonstrated today.   Electronically Signed   By: Burman Nieves M.D.   On: 12/01/2014 01:20   12/01/2014   CLINICAL DATA:  Patient fracture in the right foot a few weeks ago. 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 Pain across the first through  fifth metatarsal area.  EXAM: RIGHT FOOT COMPLETE - 3+ VIEW  COMPARISON:  None.  FINDINGS: There is a nondisplaced fracture of the distal aspect of the right third metatarsal bone and possibly also of the right second and fourth metatarsal bones. No other focal bone lesions identified. Mild dorsal soft tissue swelling.  IMPRESSION: Nondisplaced fracture of the distal aspect right third metatarsal bone and possibly also of the right second and fourth metatarsal bones.  Electronically Signed: By: Burman Nieves M.D. On: 12/01/2014 01:05   I have personally reviewed and evaluated these images and lab results as part of my medical decision-making.   EKG Interpretation None      MDM   Final diagnoses:  Metatarsal fracture, right, with routine healing, subsequent encounter    24 year old male presents to the emergency department for right foot pain secondary to running. Patient diagnosed with metatarsal fractures on his second through fourth digits of his R foot seen on Xray on 11/16/2014. He is supposed to be wearing a boot and using crutches when walking. X-ray shows stable fractures. They are nondisplaced. Patient is neurovascularly intact. He has been given a cam walker and crutches in the emergency department for stability. Will discharge with ibuprofen and instruction of follow-up with his orthopedist. Return precautions given at discharge. Patient discharged in good condition in [facility] custody.   Filed Vitals:   11/30/14 2351  BP: 125/88  Pulse: 78  Resp: 18  SpO2: 99%     Antony Madura, PA-C 12/01/14 0128  Layla Maw [place], DO 12/01/14 1610

## 2014-12-01 NOTE — Discharge Instructions (Signed)
Metatarsal Fracture, Undisplaced  A metatarsal fracture is a break in the bone(s) of the foot. These are the bones of the foot that connect your toes to the bones of the ankle.  DIAGNOSIS   The diagnoses of these fractures are usually made with X-rays. If there are problems in the forefoot and x-rays are normal a later bone scan will usually make the diagnosis.   TREATMENT AND HOME CARE INSTRUCTIONS  · Treatment may or may not include a cast or walking shoe. When casts are needed the use is usually for short periods of time so as not to slow down healing with muscle wasting (atrophy).  · Activities should be stopped until further advised by your caregiver.  · Wear shoes with adequate shock absorbing capabilities and stiff soles.  · Alternative exercise may be undertaken while waiting for healing. These may include bicycling and swimming, or as your caregiver suggests.  · It is important to keep all follow-up visits or specialty referrals. The failure to keep these appointments could result in improper bone healing and chronic pain or disability.  · Warning: Do not drive a car or operate a motor vehicle until your caregiver specifically tells you it is safe to do so.  IF YOU DO NOT HAVE A CAST OR SPLINT:  · You may walk on your injured foot as tolerated or advised.  · Do not put any weight on your injured foot for as long as directed by your caregiver. Slowly increase the amount of time you walk on the foot as the pain allows or as advised.  · Use crutches until you can bear weight without pain. A gradual increase in weight bearing may help.  · Apply ice to the injury for 15-20 minutes each hour while awake for the first 2 days. Put the ice in a plastic bag and place a towel between the bag of ice and your skin.  · Only take over-the-counter or prescription medicines for pain, discomfort, or fever as directed by your caregiver.  SEEK IMMEDIATE MEDICAL CARE IF:   · Your cast gets damaged or breaks.  · You have  continued severe pain or more swelling than you did before the cast was put on, or the pain is not controlled with medications.  · Your skin or nails below the injury turn blue or grey, or feel cold or numb.  · There is a bad smell, or new stains or pus-like (purulent) drainage coming from the cast.  MAKE SURE YOU:   · Understand these instructions.  · Will watch your condition.  · Will get help right away if you are not doing well or get worse.  Document Released: 12/14/2001 Document Revised: 06/16/2011 Document Reviewed: 11/05/2007  ExitCare® Patient Information ©2015 [employer]. This information is not intended to replace advice given to you by your health care provider. Make sure you discuss any questions you have with your health care provider.

## 2015-05-23 ENCOUNTER — Emergency Department (HOSPITAL_COMMUNITY): Payer: Self-pay

## 2015-05-23 ENCOUNTER — Emergency Department (HOSPITAL_COMMUNITY)
Admission: EM | Admit: 2015-05-23 | Discharge: 2015-05-23 | Disposition: A | Discharge status: Home / Self Care | Payer: Self-pay | Attending: Emergency Medicine | Admitting: Emergency Medicine

## 2015-05-23 ENCOUNTER — Encounter (HOSPITAL_COMMUNITY): Payer: Self-pay | Admitting: *Deleted

## 2015-05-23 DIAGNOSIS — Z880 Allergy status to penicillin: Secondary | ICD-10-CM | POA: Insufficient documentation

## 2015-05-23 DIAGNOSIS — S60141A Contusion of right ring finger with damage to nail, initial encounter: Secondary | ICD-10-CM | POA: Insufficient documentation

## 2015-05-23 DIAGNOSIS — S6010XA Contusion of unspecified finger with damage to nail, initial encounter: Secondary | ICD-10-CM

## 2015-05-23 DIAGNOSIS — Y9289 Other specified places as the place of occurrence of the external cause: Secondary | ICD-10-CM | POA: Insufficient documentation

## 2015-05-23 DIAGNOSIS — W231XXA Caught, crushed, jammed, or pinched between stationary objects, initial encounter: Secondary | ICD-10-CM | POA: Insufficient documentation

## 2015-05-23 DIAGNOSIS — H9190 Unspecified hearing loss, unspecified ear: Secondary | ICD-10-CM | POA: Insufficient documentation

## 2015-05-23 DIAGNOSIS — S6992XA Unspecified injury of left wrist, hand and finger(s), initial encounter: Secondary | ICD-10-CM | POA: Insufficient documentation

## 2015-05-23 DIAGNOSIS — Y990 Civilian activity done for income or pay: Secondary | ICD-10-CM | POA: Insufficient documentation

## 2015-05-23 DIAGNOSIS — Y9389 Activity, other specified: Secondary | ICD-10-CM | POA: Insufficient documentation

## 2015-05-23 DIAGNOSIS — F17210 Nicotine dependence, cigarettes, uncomplicated: Secondary | ICD-10-CM | POA: Insufficient documentation

## 2015-05-23 MED ORDER — ACETAMINOPHEN 325 MG PO TABS
650.0000 mg | ORAL_TABLET | Freq: Once | ORAL | Status: AC
  Administered 2015-05-23: 650 mg via ORAL
  Filled 2015-05-23: qty 2

## 2015-05-23 NOTE — Discharge Instructions (Signed)
Subungual Hematoma A subungual hematoma is a pocket of blood that collects under the fingernail or toenail. The pressure created by the blood under the nail can cause pain. CAUSES  A subungual hematoma occurs when an injury to the finger or toe causes a blood vessel beneath the nail to break. The injury can occur from a direct blow such as slamming a finger in a door. It can also occur from a repeated injury such as pressure on the foot in a shoe while running. A subungual hematoma is sometimes called runner's toe or tennis toe. SYMPTOMS   Blue or dark blue skin under the nail.  Pain or throbbing in the injured area. DIAGNOSIS  Your caregiver can determine whether you have a subungual hematoma based on your history and a physical exam. If your caregiver thinks you might have a broken (fractured) bone, X-rays may be taken. TREATMENT  Hematomas usually go away on their own over time. Your caregiver may make a hole in the nail to drain the blood. Draining the blood is painless and usually provides significant relief from pain and throbbing. The nail usually grows back normally after this procedure. In some cases, the nail may need to be removed. This is done if there is a cut under the nail that requires stitches (sutures). HOME CARE INSTRUCTIONS   Put ice on the injured area.  Put ice in a plastic bag.  Place a towel between your skin and the bag.  Leave the ice on for 15-20 minutes, 03-04 times a day for the first 1 to 2 days.  Elevate the injured area to help decrease pain and swelling.  If you were given a bandage, wear it for as long as directed by your caregiver.  If part of your nail falls off, trim the remaining nail gently. This prevents the nail from catching on something and causing further injury.  Only take over-the-counter or prescription medicines for pain, discomfort, or fever as directed by your caregiver. SEEK IMMEDIATE MEDICAL CARE IF:   You have redness or swelling  around the nail.  You have yellowish-white fluid (pus) coming from the nail.  Your pain is not controlled with medicine.  You have a fever. MAKE SURE YOU:  Understand these instructions.  Will watch your condition.  Will get help right away if you are not doing well or get worse.   This information is not intended to replace advice given to you by your health care provider. Make sure you discuss any questions you have with your health care provider.   Document Released: 03/21/2000 Document Revised: 06/16/2011 Document Reviewed: 08/09/2014 Elsevier Interactive Patient Education 2016 [employer]  

## 2015-05-23 NOTE — ED Provider Notes (Signed)
CSN: [id number]     Arrival date & time 05/23/15  0957 History  By signing my name below, I, Elon Spanner, attest that this documentation has been prepared under the direction and in the presence of Smriti Barkow, PA-C. Electronically Signed: Elon Spanner ED Scribe. 05/23/2015. 10:59 AM.    Chief Complaint  Patient presents with  . Finger Injury   Patient is a 25 y.o. male presenting with hand injury. The history is provided by the patient. No language interpreter was used.  Hand Injury Location:  Finger Time since incident:  1 day Injury: yes   Mechanism of injury: crush   Finger location:  R ring finger Pain details:    Quality:  Aching and pressure   Radiates to:  Does not radiate   Severity:  Moderate   Onset quality:  Sudden Relieved by:  Nothing Exacerbated by: Objects touching his finger. Ineffective treatments:  NSAIDs Associated symptoms: no decreased range of motion, no muscle weakness, no numbness, no stiffness and no swelling    HPI Comments: HELAMAN MECCA is a 25 y.o. male who presents to the Emergency Department complaining of bruising and moderate gradually, worsening, "heavy" and aching left index finger pain onset yesterday s/p work injury. The patient reports he was carrying a pallet of roofing tiles and his finger was briefly crushed by the pallet and a wall. The pain is worse with touch and has been unrelieved by Aleve and ibuprofen. He denies difficulty moving his finger. Denies numbness, loss of sensation or weakness in the hand. No other complaints today.   Past Medical History  Diagnosis Date  . Hearing problem    Past Surgical History  Procedure Laterality Date  . Mastoidectomy     History reviewed. No pertinent family history. Social History  Substance Use Topics  . Smoking status: Current Every Day Smoker -- 0.50 packs/day for 5 years    Types: Cigarettes  . Smokeless tobacco: None  . Alcohol Use: Yes     Comment: Mother states he "doesn't  drink often"    Review of Systems  Musculoskeletal: Positive for arthralgias. Negative for stiffness.  All other systems reviewed and are negative.     Allergies  Amoxicillin  Home Medications   Prior to Admission medications   Medication Sig Start Date End Date Taking? Authorizing Provider  ibuprofen (ADVIL,MOTRIN) 600 MG tablet Take 1 tablet (600 mg total) by mouth every 6 (six) hours as needed. 12/01/14   Antony Madura, PA-C  oxyCODONE-acetaminophen (PERCOCET/ROXICET) 5-325 MG per tablet Take 2 tablets by mouth every 4 (four) hours as needed for severe pain. 11/16/14   Samantha Tripp Dowless, PA-C   BP 110/59 mmHg  Pulse 70  Temp(Src) 98.4 F (36.9 C) (Oral)  Resp 14  SpO2 99% Physical Exam  Constitutional: He is oriented to person, place, and time. He appears well-developed and well-nourished. No distress.  HENT:  Head: Normocephalic and atraumatic.  Eyes: Conjunctivae and EOM are normal.  Neck: Neck supple. No tracheal deviation present.  Cardiovascular: Normal rate and intact distal pulses.   Pulmonary/Chest: Effort normal. No respiratory distress.  Musculoskeletal: Normal range of motion.       Right hand: He exhibits tenderness. He exhibits normal range of motion and no deformity. Normal sensation noted. Normal strength noted.       Hands: Subungual hematoma noted to right fourth digit. No swelling of the nail. Hematoma compromises approximately half of the nailbed. No swelling of the digit. No obvious deformity. Full  range of motion of the digits intact. Patient is able to make a tight fist. Generalized tenderness over the right fourth digit nail bed. No other wounds noted.  Neurological: He is alert and oriented to person, place, and time.  5/5 grip strength. Sensation and light touch intact throughout  Skin: Skin is warm and dry.  Psychiatric: He has a normal mood and affect. His behavior is normal.  Nursing note and vitals reviewed.   ED Course  Procedures  (including critical care time  DIAGNOSTIC STUDIES: Oxygen Saturation is 99% on RA, normal by my interpretation.    COORDINATION OF CARE:  11:31 AM Will order imaging of finger. Patient acknowledges and agrees with plan.    Labs Review Labs Reviewed - No data to display  Imaging Review Dg Hand Complete Right  05/23/2015  CLINICAL DATA:  Crush injury of the ring finger yesterday. Distal nail injury. EXAM: RIGHT HAND - COMPLETE 3+ VIEW COMPARISON:  None. FINDINGS: There is no evidence of fracture or dislocation. There is no evidence of arthropathy or other focal bone abnormality. Soft tissues are unremarkable. IMPRESSION: Normal radiographs Electronically Signed   By: Paulina Fusi M.D.   On: 05/23/2015 11:49   I have personally reviewed and evaluated these images and lab results as part of my medical decision-making.   EKG Interpretation None      MDM   Final diagnoses:  Subungual hematoma of digit of hand, initial encounter   25 year old male presenting with subungual hematoma after crush injury yesterday. Right hand is neurovascularly intact with full range of motion. Hand x-ray without acute injury. Discussed trephination to relieve hematoma the patient declined. Pain controlled Tylenol in the emergency department. Instructed to use ice and over-the-counter pain relievers for his injury. Return precautions given in discharge paperwork and discussed with pt at bedside. Pt stable for discharge  I personally performed the services described in this documentation, which was scribed in my presence. The recorded information has been reviewed and is accurate.    Rolm Gala Keyen Marban, PA-C 05/23/15 1240  Geoffery Lyons, MD 05/23/15 1259

## 2015-05-23 NOTE — ED Notes (Signed)
Declined W/C at D/C and was escorted to lobby by RN. 

## 2015-05-23 NOTE — ED Notes (Signed)
Pt reports he injured his RT ring finger

## 2016-07-03 ENCOUNTER — Emergency Department (HOSPITAL_COMMUNITY)
Admission: EM | Admit: 2016-07-03 | Discharge: 2016-07-04 | Disposition: A | Discharge status: Home / Self Care | Payer: Self-pay | Attending: Emergency Medicine | Admitting: Emergency Medicine

## 2016-07-03 ENCOUNTER — Emergency Department (HOSPITAL_COMMUNITY): Payer: Self-pay

## 2016-07-03 ENCOUNTER — Encounter (HOSPITAL_COMMUNITY): Payer: Self-pay | Admitting: Emergency Medicine

## 2016-07-03 DIAGNOSIS — S59912A Unspecified injury of left forearm, initial encounter: Secondary | ICD-10-CM | POA: Insufficient documentation

## 2016-07-03 DIAGNOSIS — W208XXA Other cause of strike by thrown, projected or falling object, initial encounter: Secondary | ICD-10-CM | POA: Insufficient documentation

## 2016-07-03 DIAGNOSIS — Y929 Unspecified place or not applicable: Secondary | ICD-10-CM | POA: Insufficient documentation

## 2016-07-03 DIAGNOSIS — F17210 Nicotine dependence, cigarettes, uncomplicated: Secondary | ICD-10-CM | POA: Insufficient documentation

## 2016-07-03 DIAGNOSIS — Y990 Civilian activity done for income or pay: Secondary | ICD-10-CM | POA: Insufficient documentation

## 2016-07-03 DIAGNOSIS — Z79899 Other long term (current) drug therapy: Secondary | ICD-10-CM | POA: Insufficient documentation

## 2016-07-03 DIAGNOSIS — Y939 Activity, unspecified: Secondary | ICD-10-CM | POA: Insufficient documentation

## 2016-07-03 MED ORDER — IBUPROFEN 400 MG PO TABS
ORAL_TABLET | ORAL | Status: AC
  Filled 2016-07-03: qty 1

## 2016-07-03 MED ORDER — IBUPROFEN 400 MG PO TABS
400.0000 mg | ORAL_TABLET | Freq: Once | ORAL | Status: AC
  Administered 2016-07-03: 400 mg via ORAL

## 2016-07-03 NOTE — ED Provider Notes (Signed)
[facility]-EMERGENCY DEPT Provider Note   CSN: [id number] Arrival date & time: 07/03/16  2225  By signing my name below, I, Linna DarnerRussell Turner, attest that this documentation has been prepared under the direction and in the presence of [facility] M. Damian LeavellNeese, NP. Electronically Signed: Linna Darnerussell Turner, Scribe. 07/03/2016. 11:59 PM.  History   Chief Complaint Chief Complaint  Patient presents with  . Arm Pain    The history is provided by the patient. No language interpreter was used.  Arm Pain  This is a new problem. The current episode started yesterday. The problem occurs constantly. The problem has not changed since onset.Pertinent negatives include no chest pain, no abdominal pain, no headaches and no shortness of breath. Nothing relieves the symptoms. He has tried nothing for the symptoms.     HPI Comments: Kristopher Boutonimothy J Jose is a 26 y.o. male who presents to the Emergency Department complaining of constant, gradually worsening, left forearm pain beginning yesterday. He reports associated swelling and notes the pain shoots into his left wrist. Pt initially injured his left forearm yesterday when an 80 lb bundle of shingles fell on his left forearm at work. He states his pain worsened today at work when he "caught" himself with his left forearm to avoid falling. Pt reports his pain is worse with applied pressure to his left forearm. No alleviating factors noted. He denies numbness/tingling, wounds, or any other associated symptoms.  Past Medical History:  Diagnosis Date  . Hearing problem     There are no active problems to display for this patient.   Past Surgical History:  Procedure Laterality Date  . MASTOIDECTOMY        Home Medications    Prior to Admission medications   Medication Sig Start Date End Date Taking? Authorizing Provider  diclofenac (VOLTAREN) 50 MG EC tablet Take 1 tablet (50 mg total) by mouth 2 (two) times daily. 07/04/16   Hope Orlene OchM Neese, NP  ibuprofen (ADVIL,MOTRIN) 600  MG tablet Take 1 tablet (600 mg total) by mouth every 6 (six) hours as needed. 12/01/14   Antony MaduraKelly Humes, PA-C  oxyCODONE-acetaminophen (PERCOCET/ROXICET) 5-325 MG per tablet Take 2 tablets by mouth every 4 (four) hours as needed for severe pain. 11/16/14   Samantha Tripp Dowless, PA-C  traMADol (ULTRAM) 50 MG tablet Take 1 tablet (50 mg total) by mouth every 6 (six) hours as needed. 07/04/16   Hope Orlene OchM Neese, NP    Family History No family history on file.  Social History Social History  Substance Use Topics  . Smoking status: Current Every Day Smoker    Packs/day: 0.50    Years: 5.00    Types: Cigarettes  . Smokeless tobacco: Never Used  . Alcohol use Yes     Comment: Mother states he "doesn't drink often"     Allergies   Amoxicillin   Review of Systems Review of Systems  Respiratory: Negative for shortness of breath.   Cardiovascular: Negative for chest pain.  Gastrointestinal: Negative for abdominal pain.  Musculoskeletal: Positive for arthralgias, joint swelling and myalgias.  Skin: Negative for wound.  Neurological: Negative for syncope, numbness and headaches.   Physical Exam Updated Vital Signs BP 119/68 (BP Location: Right Arm)   Pulse 62   Temp 97.8 F (36.6 C) (Oral)   Resp 18   SpO2 97%   Physical Exam  Constitutional: He is oriented to person, place, and time. He appears well-developed and well-nourished. No distress.  HENT:  Head: Normocephalic and atraumatic.  Eyes: Conjunctivae  and EOM are normal.  Neck: Neck supple. No tracheal deviation present.  Cardiovascular: Normal rate.   Pulses:      Radial pulses are 2+ on the right side, and 2+ on the left side.  Pulmonary/Chest: Effort normal. No respiratory distress.  Musculoskeletal: He exhibits no deformity.  Swelling of the left forearm. Tender on palpation Pain of the forearm with ROM of left wrist. Adequate circulation.  Neurological: He is alert and oriented to person, place, and time.  Skin: Skin is  warm and dry.  Psychiatric: He has a normal mood and affect. His behavior is normal.  Nursing note and vitals reviewed.  ED Treatments / Results  Labs (all labs ordered are listed, but only abnormal results are displayed) Labs Reviewed - No data to display Radiology Dg Forearm Left  Result Date: 07/03/2016 CLINICAL DATA:  Left forearm pain. 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 EXAM: LEFT FOREARM - 2 VIEW COMPARISON:  None. FINDINGS: There is no evidence of fracture or other focal bone lesions. Wrist and elbow alignment is maintained. Minimal soft tissue edema about the volar forearm. No radiopaque foreign body. IMPRESSION: Mild soft tissue edema.  No fracture or acute osseous abnormality. Electronically Signed   By: Rubye Oaks M.D.   On: 07/03/2016 23:19    Procedures Procedures (including critical care time)  DIAGNOSTIC STUDIES: Oxygen Saturation is 99% on RA, normal by my interpretation.    COORDINATION OF CARE: 12:04 AM Discussed treatment plan with pt at bedside and pt agreed to plan.  Medications Ordered in ED Medications  ibuprofen (ADVIL,MOTRIN) tablet 400 mg (400 mg Oral Given 07/03/16 2253)  oxyCODONE-acetaminophen (PERCOCET/ROXICET) 5-325 MG per tablet 1 tablet (1 tablet Oral Given 07/04/16 0030)     Initial Impression / Assessment and Plan / ED Course  I have reviewed the triage vital signs and the nursing notes.  Pertinent labresults that were available during my care of the patient were reviewed by me and considered in my medical decision making (see chart for details).  Final Clinical Impressions(s) / ED Diagnoses  Patient X-Ray negative for obvious fracture or dislocation.  Pt advised to follow up with orthopedics. Patient given splint while in ED, conservative therapy recommended and discussed. Patient will be discharged home & is agreeable with above plan. Returns precautions discussed. Pt appears safe for discharge no  focal neuro deficits. Final diagnoses:  Forearm injury, left, initial encounter    New Prescriptions Discharge Medication List as of 07/04/2016 12:31 AM    START taking these medications   Details  diclofenac (VOLTAREN) 50 MG EC tablet Take 1 tablet (50 mg total) by mouth 2 (two) times daily., Starting Fri 07/04/2016, Print    traMADol (ULTRAM) 50 MG tablet Take 1 tablet (50 mg total) by mouth every 6 (six) hours as needed., Starting Fri 07/04/2016, Print       I personally performed the services described in this documentation, which was scribed in my presence. The recorded information has been reviewed and is accurate.    Janne Napoleon, NP 07/05/16 1726    Melene Plan, DO 07/08/16 1506

## 2016-07-03 NOTE — ED Triage Notes (Signed)
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 Strength decreased in L hand.

## 2016-07-03 NOTE — ED Notes (Signed)
Pt refused workman's comp paperwork

## 2016-07-04 IMAGING — DX DG LUMBAR SPINE COMPLETE 4+V
5 series · 5 of 5 positions shown · non-contrast
Comparison: None.

CLINICAL DATA: Fell off roof, low back pain

EXAM:
LUMBAR SPINE - COMPLETE 4+ VIEW

[t lumbar spine ap]
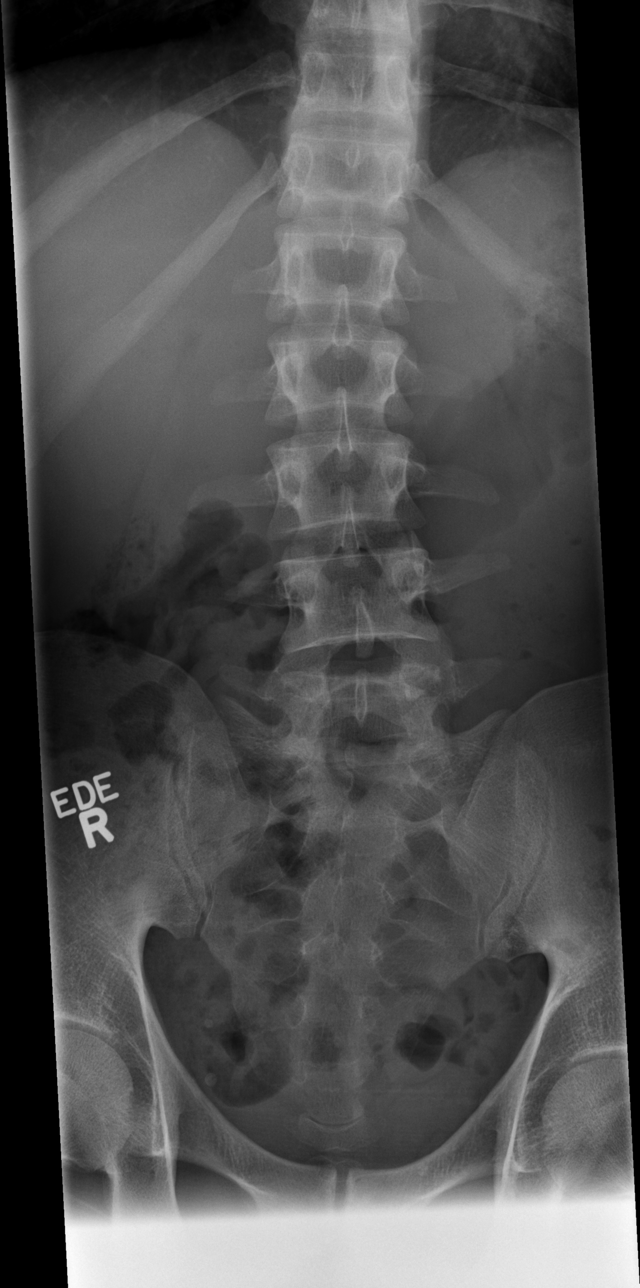

[t lumbar spine obl (1 of 2)]
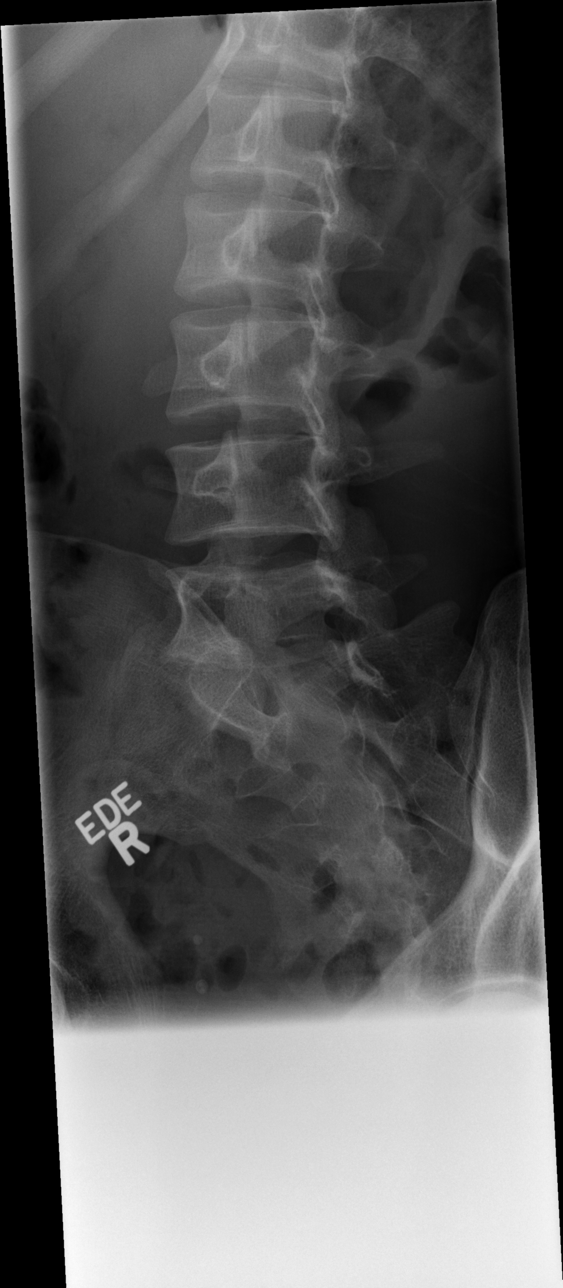

[t lumbar spine obl (2 of 2)]
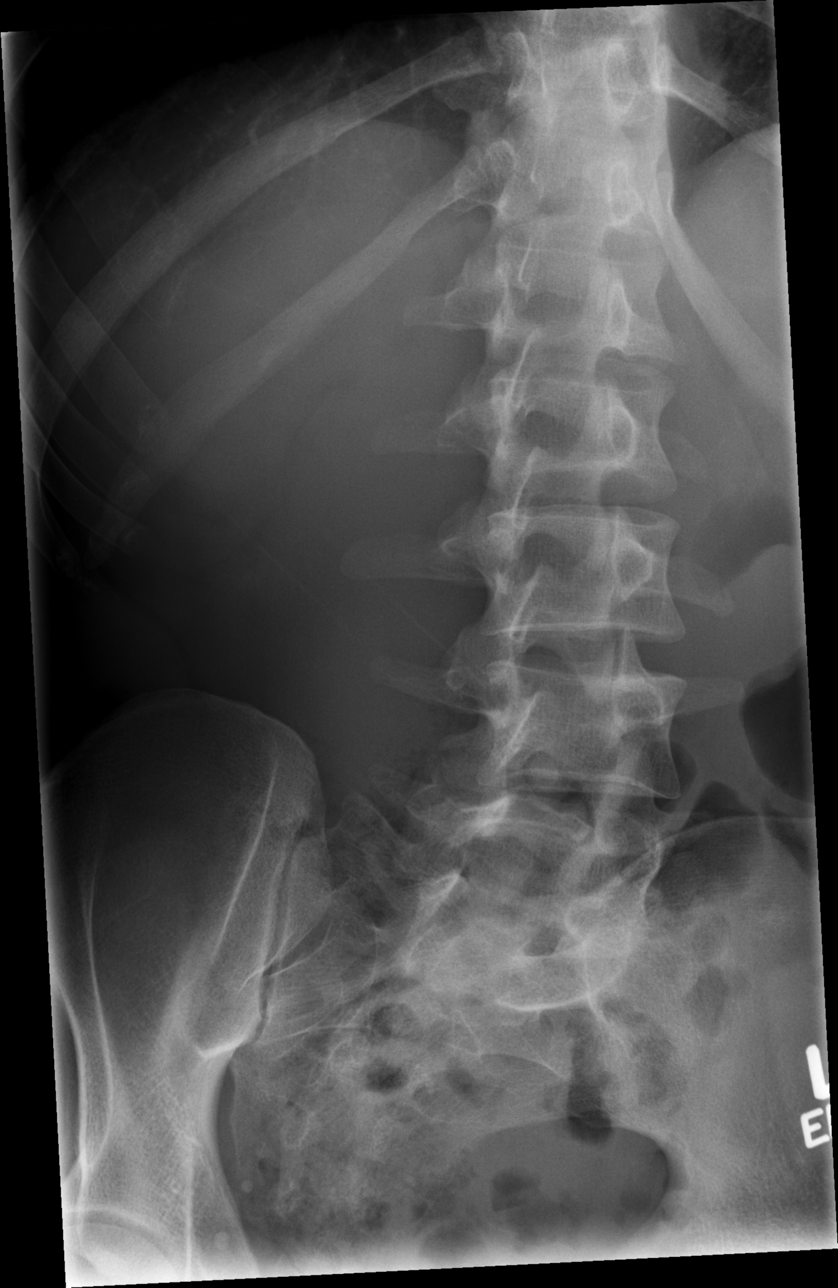

[t lumbar spine lat]
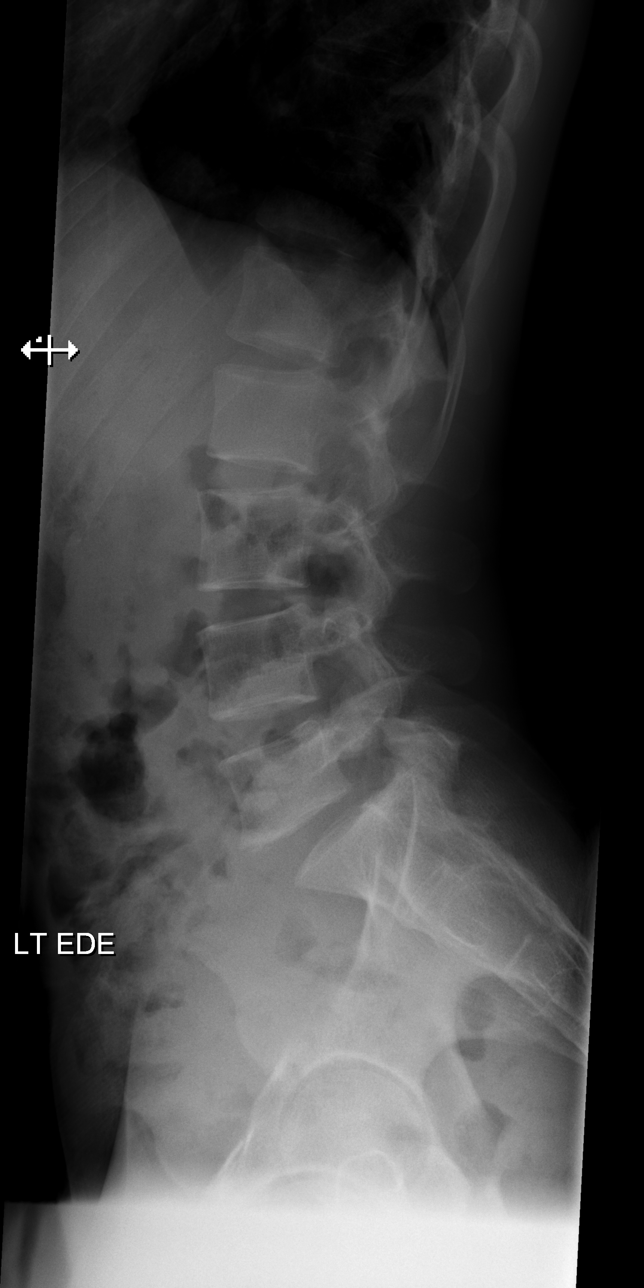

[t lumbar l-5 s-1 spot]
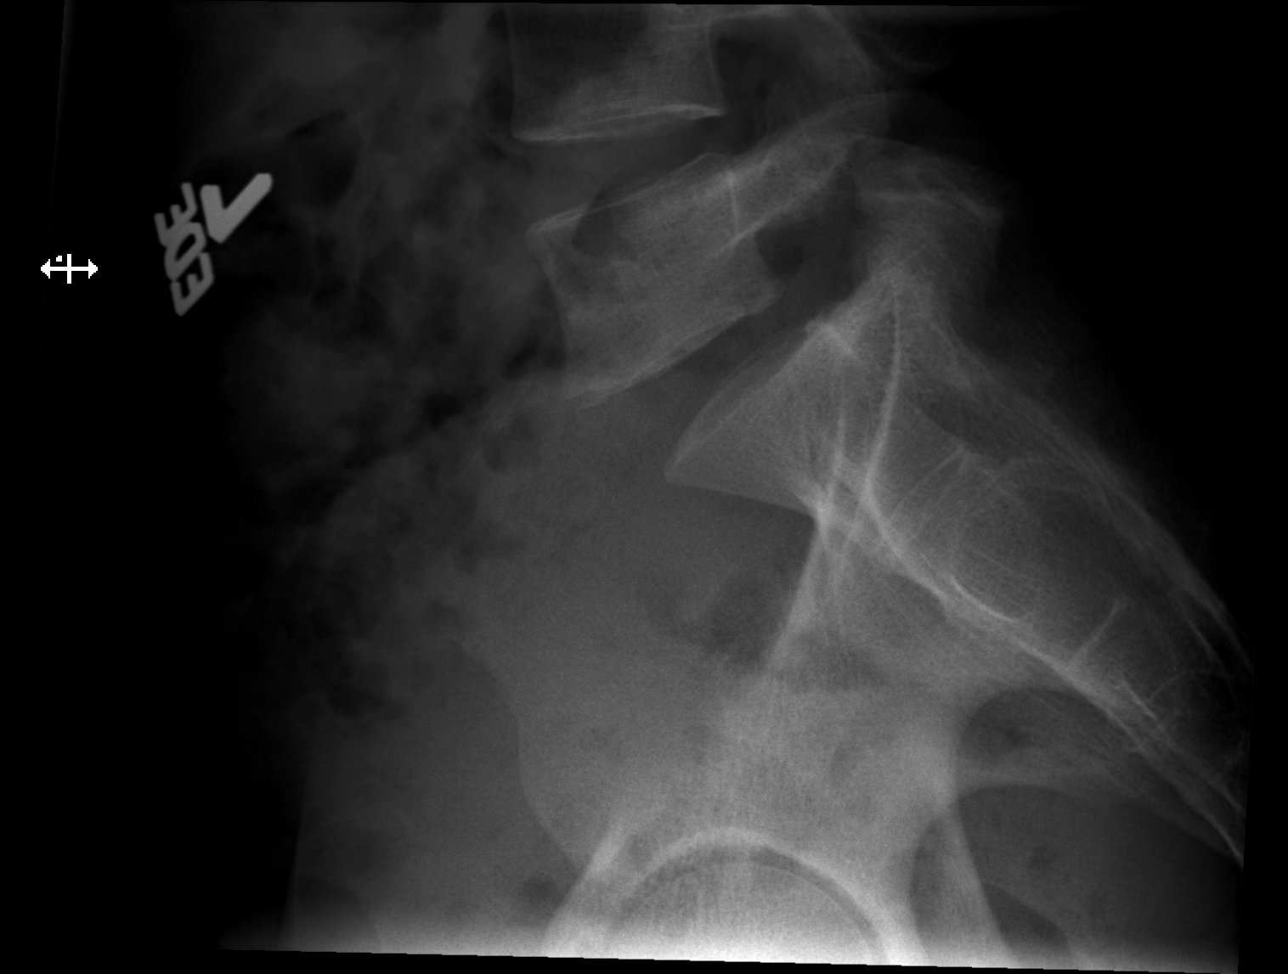

[5 of 5 positions shown; findings below may reference images not displayed]

FINDINGS: The lumbar vertebrae are in normal alignment. Intervertebral disc
spaces appear normal. No definite pars defect is seen. The SI joints
are corticated.
IMPRESSION: Normal alignment. Normal intervertebral disc spaces. No acute
abnormality.

## 2016-07-04 MED ORDER — OXYCODONE-ACETAMINOPHEN 5-325 MG PO TABS
1.0000 | ORAL_TABLET | Freq: Once | ORAL | Status: AC
  Administered 2016-07-04: 1 via ORAL
  Filled 2016-07-04: qty 1

## 2016-07-04 MED ORDER — DICLOFENAC SODIUM 50 MG PO TBEC: 50.0000 mg | DELAYED_RELEASE_TABLET | Freq: Two times a day (BID) | ORAL | 0 refills | Status: DC

## 2016-07-04 MED ORDER — TRAMADOL HCL 50 MG PO TABS: 50.0000 mg | ORAL_TABLET | Freq: Four times a day (QID) | ORAL | 0 refills | Status: DC | PRN

## 2016-07-04 NOTE — ED Notes (Signed)
See EDP assessment 

## 2016-07-04 NOTE — Discharge Instructions (Signed)
Do not take the narcotic while driving as it will make you sleepy.

## 2016-07-04 NOTE — Progress Notes (Signed)
Orthopedic Tech Progress Note Patient Details:  Kristopher Robinson February 08, 1991 [id number]  Ortho Devices Type of Ortho Device: Velcro wrist forearm splint Ortho Device/Splint Location: lue Ortho Device/Splint Interventions: Ordered, Application   Trinna Post 07/04/2016, 12:28 AM

## 2017-05-11 ENCOUNTER — Other Ambulatory Visit: Payer: Self-pay

## 2017-05-11 ENCOUNTER — Encounter (HOSPITAL_BASED_OUTPATIENT_CLINIC_OR_DEPARTMENT_OTHER): Payer: Self-pay | Admitting: *Deleted

## 2017-05-11 ENCOUNTER — Emergency Department (HOSPITAL_BASED_OUTPATIENT_CLINIC_OR_DEPARTMENT_OTHER)
Admission: EM | Admit: 2017-05-11 | Discharge: 2017-05-11 | Discharge status: Against Medical Advice | Payer: Self-pay | Attending: Emergency Medicine | Admitting: Emergency Medicine

## 2017-05-11 DIAGNOSIS — Z4802 Encounter for removal of sutures: Secondary | ICD-10-CM | POA: Insufficient documentation

## 2017-05-11 DIAGNOSIS — Z5321 Procedure and treatment not carried out due to patient leaving prior to being seen by health care provider: Secondary | ICD-10-CM | POA: Insufficient documentation

## 2017-05-11 NOTE — ED Triage Notes (Signed)
Pt here fro suture removal . PL[place] 1 [place] ago in [place] state.

## 2017-05-11 NOTE — ED Notes (Signed)
Pt called to treatment room with no answer from lobby.  

## 2021-04-15 ENCOUNTER — Encounter (HOSPITAL_BASED_OUTPATIENT_CLINIC_OR_DEPARTMENT_OTHER): Payer: Self-pay

## 2021-04-15 ENCOUNTER — Emergency Department (HOSPITAL_BASED_OUTPATIENT_CLINIC_OR_DEPARTMENT_OTHER)
Admission: EM | Admit: 2021-04-15 | Discharge: 2021-04-15 | Disposition: A | Discharge status: Home / Self Care | Payer: Medicaid - Out of State | Attending: Emergency Medicine | Admitting: Emergency Medicine

## 2021-04-15 ENCOUNTER — Other Ambulatory Visit: Payer: Self-pay

## 2021-04-15 ENCOUNTER — Emergency Department (HOSPITAL_BASED_OUTPATIENT_CLINIC_OR_DEPARTMENT_OTHER): Payer: Medicaid - Out of State

## 2021-04-15 ENCOUNTER — Emergency Department: Payer: Medicaid - Out of State

## 2021-04-15 DIAGNOSIS — Y92017 Garden or yard in single-family (private) house as the place of occurrence of the external cause: Secondary | ICD-10-CM | POA: Insufficient documentation

## 2021-04-15 DIAGNOSIS — R202 Paresthesia of skin: Secondary | ICD-10-CM | POA: Insufficient documentation

## 2021-04-15 DIAGNOSIS — W19XXXA Unspecified fall, initial encounter: Secondary | ICD-10-CM

## 2021-04-15 DIAGNOSIS — W010XXA Fall on same level from slipping, tripping and stumbling without subsequent striking against object, initial encounter: Secondary | ICD-10-CM | POA: Insufficient documentation

## 2021-04-15 DIAGNOSIS — S299XXA Unspecified injury of thorax, initial encounter: Secondary | ICD-10-CM | POA: Insufficient documentation

## 2021-04-15 MED ORDER — IBUPROFEN 800 MG PO TABS
800.0000 mg | ORAL_TABLET | Freq: Once | ORAL | Status: AC
  Administered 2021-04-15: 800 mg via ORAL
  Filled 2021-04-15: qty 1

## 2021-04-15 NOTE — ED Triage Notes (Signed)
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 Pt reports pain is worse with movement and deep breathing.

## 2021-04-15 NOTE — Discharge Instructions (Signed)
You are seen in the ER today after your fall.  Your physical exam and x-rays are reassuring.  You do not have any broken bones.  You likely have bruised to the area under your arm fairly significantly.  You may alternate Tylenol with either ibuprofen or naproxen every 3 hours as needed for your discomfort.Gustavus Bryant the area a few times a day for 15 to 20 minutes at a time.  Follow-up with your primary care doctor and return to the ER with any new numbness, ting, weakness in your hand, or any other new severe symptom.

## 2021-04-15 NOTE — ED Provider Notes (Signed)
[facility] EMERGENCY DEPARTMENT Provider Note   CSN: [id number] Arrival date & time: 04/15/21  1026     History  Chief Complaint  Patient presents with   Rib Injury    Kristopher Robinson is a 31 y.o. male.  Who presents with concern for left-sided rib pain/armpit pain after injury today.  States that he was at his mother-in-law's home trying to help her clean up her yard when he slipped and fell sideways landing on a piece of metal yard equipment directly on his left armpit.  Initially endorsed some tingling sensation in his hand and exquisite pain.  Has not taken any medication for this prior to his arrival.  He denies any weakness in the hand.  No head trauma, LOC, blurred or double vision, nausea, or vomiting.  Who presented immediately to the emergency department after the injury.  No history of injury to the area in the past.  I have personally reviewed this patient's medical records.  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  HPI     Home Medications Prior to Admission medications   Not on File      Allergies    Amoxicillin and Penicillins    Review of Systems   Review of Systems  Constitutional: Negative.   HENT: Negative.    Respiratory: Negative.    Cardiovascular: Negative.   Genitourinary: Negative.   Musculoskeletal:  Positive for myalgias.  Neurological: Negative.   Hematological: Negative.    Physical Exam Updated Vital Signs BP 108/73 (BP Location: Right Arm)    Pulse (!) 59    Temp 97.8 F (36.6 C) (Oral)    Resp 18    Ht 5\' 6"  (1.676 m)    Wt 63.5 kg    SpO2 99%    BMI 22.60 kg/m  Physical Exam Vitals and nursing note reviewed.  Constitutional:      Appearance: He is not toxic-appearing.  HENT:     Head: Normocephalic and atraumatic.     Nose: Nose normal.     Mouth/Throat:     Mouth: Mucous membranes are moist.     Pharynx: No  oropharyngeal exudate or posterior oropharyngeal erythema.  Eyes:     General:        Right eye: No discharge.        Left eye: No discharge.     Conjunctiva/sclera: Conjunctivae normal.  Cardiovascular:     Rate and Rhythm: Normal rate and regular rhythm.     Pulses: Normal pulses.  Pulmonary:     Effort: Pulmonary effort is normal. No respiratory distress.     Breath sounds: Normal breath sounds. No wheezing or rales.  Chest:     Chest wall: Tenderness present. No deformity, swelling or crepitus.    Abdominal:     General: Bowel sounds are normal. There is no distension.     Tenderness: There is no abdominal tenderness. There is no guarding or rebound.  Musculoskeletal:        General: No deformity.     Right shoulder: Normal.     Left shoulder: Normal.     Right upper arm: Normal.     Left upper arm: Normal.     Right elbow: Normal.     Left elbow: Normal.     Right forearm: Normal.     Left forearm: Normal.  Right wrist: Normal.     Left wrist: Normal.     Right hand: Normal.     Left hand: Normal.       Arms:     Cervical back: Normal range of motion and neck supple.     Comments: Normal brachial and radial pulses in the left arm with normal cap refill and sensation in all 5 digits of the left hand.  Full range of motion of the shoulder though with pain in the axilla with abduction of the arm.  Axillae are symmetric.  Skin:    General: Skin is warm and dry.     Capillary Refill: Capillary refill takes less than 2 seconds.  Neurological:     General: No focal deficit present.     Mental Status: He is alert. Mental status is at baseline.  Psychiatric:        Mood and Affect: Mood normal.    ED Results / Procedures / Treatments   Labs (all labs ordered are listed, but only abnormal results are displayed) Labs Reviewed - No data to display  EKG None  Radiology DG Ribs Unilateral W/Chest Left  Result Date: 04/15/2021 CLINICAL DATA:  Trauma, fall EXAM: LEFT  RIBS AND CHEST - 3+ VIEW COMPARISON:  03/22/2011 FINDINGS: Cardiac size is within normal limits. There are no focal pulmonary infiltrates. There is no pleural effusion or pneumothorax. No displaced fracture is seen in the left ribs. IMPRESSION: There are no displaced fractures in the left ribs. No focal pulmonary infiltrates are seen. There is no pleural effusion or pneumothorax. Electronically Signed   By: Ernie AvenaPalani  Rathinasamy M.D.   On: 04/15/2021 11:32    Procedures Procedures    Medications Ordered in ED Medications - No data to display  ED Course/ Medical Decision Making/ A&P                           Medical Decision Making 31 year old male who presents after fall for evaluation of the left ribs and axilla where he has pain. Vital signs are normal on intake.  Cardiopulmonary exam is normal, abdominal exam is benign.  Tenderness palpation in the left axilla and left superior lateral ribs along mid axillary line without evidence of trauma on physical exam.  Neurovascularly intact distal to the injury on the left arm.  With symmetric grip strength bilaterally.  Plain film of the ribs and chest was obtained which was negative for acute intrathoracic abnormality.  Given reassuring physical exam and chest x-ray, no further work-up is warranted near this time.  Suspect contusion to the soft tissues of the axilla, however given reassuring neurovascular exam of the left upper extremity do not feel any further imaging is warranted at this time.  Patient is safe for discharge home and may continue to use Tylenol and ibuprofen as needed for his discomfort.  Recommend ice to the area and close outpatient follow-up with his PCP.  Strict return precautions were given.  Jasten voiced understanding with medical evaluation and treatment plan.  Each of his questions was answered to his expressed satisfaction.  Patient is well-appearing, stable, and appropriate for discharge at this time.    This chart was  dictated using voice recognition software, Dragon. Despite the best efforts of this provider to proofread and correct errors, errors may still occur which can change documentation meaning.  Final Clinical Impression(s) / ED Diagnoses Final diagnoses:  None    Rx / DC Orders  ED Discharge Orders     None         Sherrilee Gilles 04/16/21 1023    Rolan Bucco, MD 04/16/21 [phone]

## 2022-07-11 ENCOUNTER — Encounter (HOSPITAL_COMMUNITY): Payer: Self-pay

## 2022-07-11 ENCOUNTER — Other Ambulatory Visit: Payer: Self-pay

## 2022-07-11 ENCOUNTER — Emergency Department (HOSPITAL_COMMUNITY)
Admission: EM | Admit: 2022-07-11 | Discharge: 2022-07-11 | Disposition: A | Discharge status: Home / Self Care | Payer: Medicaid - Out of State | Attending: Emergency Medicine | Admitting: Emergency Medicine

## 2022-07-11 DIAGNOSIS — S61211S Laceration without foreign body of left index finger without damage to nail, sequela: Secondary | ICD-10-CM | POA: Diagnosis present

## 2022-07-11 DIAGNOSIS — W260XXA Contact with knife, initial encounter: Secondary | ICD-10-CM | POA: Diagnosis not present

## 2022-07-11 MED ORDER — CEPHALEXIN 500 MG PO CAPS: 500.0000 mg | ORAL_CAPSULE | Freq: Two times a day (BID) | ORAL | 0 refills | Status: AC

## 2022-07-11 MED ORDER — LIDOCAINE HCL (PF) 1 % IJ SOLN
30.0000 mL | Freq: Once | INTRAMUSCULAR | Status: AC
  Administered 2022-07-11: 30 mL via INTRADERMAL
  Filled 2022-07-11: qty 30

## 2022-07-11 NOTE — ED Triage Notes (Signed)
Patient is here for evaluation of right index finger laceration. Reports being cut with a roofing knife. States the blade was brand new. Bleeding controlled with pressure.

## 2022-07-11 NOTE — ED Provider Notes (Signed)
  [facility] EMERGENCY DEPARTMENT AT [facility] Provider Note   CSN: [id number] Arrival date & time: 07/11/22  8546     History {Add pertinent medical, surgical, social history, OB history to HPI:1} Chief Complaint  Patient presents with   Extremity Laceration    Kristopher Robinson is a 32 y.o. male.  Patient actually cut his lateral right index finger midshaft with a knife.   Laceration      Home Medications Prior to Admission medications   Not on File      Allergies    Amoxicillin and Penicillins    Review of Systems   Review of Systems  Physical Exam Updated Vital Signs BP 127/89 (BP Location: Right Arm)   Pulse 77   Temp 98.8 F (37.1 C) (Oral)   Resp 19   Ht 5\' 6"  (1.676 m)   Wt 63.5 kg   SpO2 99%   BMI 22.60 kg/m  Physical Exam  ED Results / Procedures / Treatments   Labs (all labs ordered are listed, but only abnormal results are displayed) Labs Reviewed - No data to display  EKG None  Radiology No results found.  Procedures Procedures  {Document cardiac monitor, telemetry assessment procedure when appropriate:1}  Medications Ordered in ED Medications  lidocaine (PF) (XYLOCAINE) 1 % injection 30 mL (30 mLs Intradermal Given by Other 07/11/22 1031)    ED Course/ Medical Decision Making/ A&P   {Patient had a digital block done with 1% lidocaine and no epi.  Also laceration repair of 2 cm.  Laceration was closed with 5, 5-0 nylon sutures Click here for ABCD2, HEART and other calculatorsREFRESH Note before signing :1}                          Medical Decision Making Risk Prescription drug management.   Laceration to right index finger superficial.  Patient will get stitches out in 2 weeks and is given Keflex  {Document critical care time when appropriate:1} {Document review of labs and clinical decision tools ie heart score, Chads2Vasc2 etc:1}  {Document your independent review of radiology images, and any outside  records:1} {Document your discussion with family members, caretakers, and with consultants:1} {Document social determinants of health affecting pt's care:1} {Document your decision making why or why not admission, treatments were needed:1} Final Clinical Impression(s) / ED Diagnoses Final diagnoses:  Laceration of left index finger without foreign body without damage to nail, sequela    Rx / DC Orders ED Discharge Orders     None

## 2022-07-11 NOTE — Discharge Instructions (Signed)
Clean laceration twice a day with soap and water.  Keep it covered while you are working.  Get the stitches out in 12 to 14 days.  You can go to a family doctor or an urgent care or if necessary come to the emergency department to get the stitches removed but sometimes in the emergency department you can wait a considerable amount of time
